# Patient Record
Sex: Male | Born: 1955 | Race: White | Hispanic: No | Marital: Married | Smoking: Never smoker
Health system: Southern US, Community
[De-identification: ages and names within clinical notes are randomized; demographics above are authoritative.]

## PROBLEM LIST (undated history)

## (undated) DIAGNOSIS — E039 Hypothyroidism, unspecified: Secondary | ICD-10-CM

## (undated) DIAGNOSIS — E119 Type 2 diabetes mellitus without complications: Secondary | ICD-10-CM

## (undated) DIAGNOSIS — M1990 Unspecified osteoarthritis, unspecified site: Secondary | ICD-10-CM

## (undated) DIAGNOSIS — I10 Essential (primary) hypertension: Secondary | ICD-10-CM

## (undated) DIAGNOSIS — K219 Gastro-esophageal reflux disease without esophagitis: Secondary | ICD-10-CM

## (undated) DIAGNOSIS — F3130 Bipolar disorder, current episode depressed, mild or moderate severity, unspecified: Secondary | ICD-10-CM

## (undated) HISTORY — PX: DUPUYTREN / PALMAR FASCIOTOMY: SUR601

## (undated) HISTORY — PX: TONSILLECTOMY: SUR1361

## (undated) HISTORY — PX: ANKLE FRACTURE SURGERY: SHX122

## (undated) HISTORY — PX: CLOSED REDUCTION HAND FRACTURE: SHX973

## (undated) HISTORY — PX: ELBOW ARTHROPLASTY: SHX928

---

## 1989-06-26 HISTORY — PX: WRIST SURGERY: SHX841

## 2001-09-04 ENCOUNTER — Other Ambulatory Visit (HOSPITAL_COMMUNITY): Admission: RE | Admit: 2001-09-04 | Discharge: 2001-09-16 | Payer: Self-pay | Admitting: Psychiatry

## 2015-03-19 ENCOUNTER — Ambulatory Visit (INDEPENDENT_AMBULATORY_CARE_PROVIDER_SITE_OTHER): Payer: Medicare Other | Admitting: Psychiatry

## 2015-03-19 ENCOUNTER — Encounter
Admission: RE | Admit: 2015-03-19 | Discharge: 2015-03-19 | Disposition: A | Discharge status: Home / Self Care | Payer: Medicare Other | Source: Ambulatory Visit | Attending: Psychiatry | Admitting: Psychiatry

## 2015-03-19 ENCOUNTER — Ambulatory Visit
Admission: RE | Admit: 2015-03-19 | Discharge: 2015-03-19 | Disposition: A | Discharge status: Home / Self Care | Payer: Medicare Other | Source: Ambulatory Visit | Attending: Psychiatry | Admitting: Psychiatry

## 2015-03-19 DIAGNOSIS — F314 Bipolar disorder, current episode depressed, severe, without psychotic features: Secondary | ICD-10-CM | POA: Diagnosis not present

## 2015-03-19 DIAGNOSIS — E119 Type 2 diabetes mellitus without complications: Secondary | ICD-10-CM

## 2015-03-19 DIAGNOSIS — Z01811 Encounter for preprocedural respiratory examination: Secondary | ICD-10-CM | POA: Diagnosis not present

## 2015-03-19 DIAGNOSIS — E039 Hypothyroidism, unspecified: Secondary | ICD-10-CM

## 2015-03-19 DIAGNOSIS — I10 Essential (primary) hypertension: Secondary | ICD-10-CM

## 2015-03-19 HISTORY — DX: Hypothyroidism, unspecified: E03.9

## 2015-03-19 HISTORY — DX: Bipolar disorder, current episode depressed, mild or moderate severity, unspecified: F31.30

## 2015-03-19 HISTORY — DX: Essential (primary) hypertension: I10

## 2015-03-19 HISTORY — DX: Type 2 diabetes mellitus without complications: E11.9

## 2015-03-19 HISTORY — DX: Gastro-esophageal reflux disease without esophagitis: K21.9

## 2015-03-19 LAB — URINALYSIS COMPLETE WITH MICROSCOPIC (ARMC ONLY)
BACTERIA UA: NONE SEEN
Bilirubin Urine: NEGATIVE
GLUCOSE, UA: 150 mg/dL — AB
Hgb urine dipstick: NEGATIVE
Ketones, ur: NEGATIVE mg/dL
Leukocytes, UA: NEGATIVE
Nitrite: NEGATIVE
Protein, ur: NEGATIVE mg/dL
RBC / HPF: NONE SEEN RBC/hpf (ref 0–5)
SQUAMOUS EPITHELIAL / LPF: NONE SEEN
Specific Gravity, Urine: 1.017 (ref 1.005–1.030)
pH: 5 (ref 5.0–8.0)

## 2015-03-19 LAB — BASIC METABOLIC PANEL
ANION GAP: 8 (ref 5–15)
BUN: 22 mg/dL — ABNORMAL HIGH (ref 6–20)
CHLORIDE: 102 mmol/L (ref 101–111)
CO2: 27 mmol/L (ref 22–32)
Calcium: 9.2 mg/dL (ref 8.9–10.3)
Creatinine, Ser: 0.92 mg/dL (ref 0.61–1.24)
GFR calc Af Amer: >60 mL/min (ref >60)
GLUCOSE: 115 mg/dL — AB (ref 65–99)
POTASSIUM: 3.8 mmol/L (ref 3.5–5.1)
Sodium: 137 mmol/L (ref 135–145)

## 2015-03-19 LAB — CBC
HEMATOCRIT: 41.9 % (ref 40.0–52.0)
HEMOGLOBIN: 14.1 g/dL (ref 13.0–18.0)
MCH: 30.1 pg (ref 26.0–34.0)
MCHC: 33.7 g/dL (ref 32.0–36.0)
MCV: 89.4 fL (ref 80.0–100.0)
PLATELETS: 331 10*3/uL (ref 150–440)
RBC: 4.68 MIL/uL (ref 4.40–5.90)
RDW: 13 % (ref 11.5–14.5)
WBC: 9.1 10*3/uL (ref 3.8–10.6)

## 2015-03-19 NOTE — Progress Notes (Signed)
[facility] MD Progress Note  03/19/2015 1:58 PM Theodore Long  MRN:  [id number] Subjective:  This is a new patient ECT consult for patient referred by Dr.Kaur Principal Problem: @ Diagnosis:  There are no active problems to display for this patient. 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 No past surgical history on file. Family History: No family history on file. Social History:  History  Alcohol Use: Not on file     History  Drug Use Not on file    Social History   Social History  . Marital Status: Married    Spouse Name: N/A  . Number of Children: N/A  . Years of Education: N/A   Social History Main Topics  . Smoking status: Not on file  . Smokeless tobacco: Not on file  . Alcohol Use: Not on file  . Drug Use: Not on file  . Sexual Activity: Not on file   Other Topics Concern  . Not on file   Social History Narrative  . No narrative on file   Additional History:  this is the full history. This is a 59 year old man with a history of bipolar disorder who is referred for ECT evaluation. Patient reports current symptoms of consistently sad depressed mood. Lack of any interest in pleasurable activities. Poor appetite poor. Poor energy level. Generally negative thinking. Passive thoughts of wishing he were dead but without any suicidal intent or plan. Poor concentration. Feeling generally miserableand negative. He denies any hallucinationsor delusions. He is currently seeing an outpatient psychiatrist and is on appropriate psychiatric medicine including Symbyax 6/50 a day and lamotrigine 150 mg twice a day. He has been on these medicines for at least a month. Continues to have no improvement in his mood. Very poor functioning right now.  Past psychiatric history as a first diagnosis of bipolar disorder in 1998 with a hospitalization. He has  had one other inpatient treatment since then. He has been managed with medication for bipolar disorder for years. He has had both episodes of mania or at least hypomania and major depression. For many years he was stable on lithium but recently became intolerant and it was discontinued this summer. No history of suicide attempts. No clear history of psychosis.  Prior medicines include lithium, Tegretol, lamotrigine, amitriptyline and his current Symbyax.  Medical history: Patient has mild hypertension-controlled on lisinopril. He has type 2 diabetes well controlled on metformin. Mild hypothyroidism. No history of coronary artery disease or stroke.  Substance abuse history: Drinks rarely. No history of alcohol or drug abuse.  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 He has recently cut down on his hours because of his depression. He is married and has a stable relationship with his wife who is understanding and willing to do transportation for him.  Family history: Nonidentified  Current medications psychiatrically lamotrigine 150 mg twice a day, clonazepam 2 mg at night, amitriptyline 150 mg at night, Symbyax 66/50 mg at night  Patient has had ECT in the past in 2007 at [facility]. He reports that he had 3 treatments done in the right unilateral fashion with good response and improvement in his mood. Does not recall serious side effects. Sleep: Fair  Appetite:  Poor   Assessment:   Musculoskeletal: Strength & Muscle  Tone: within normal limits Gait & Station: normal Patient leans: N/A   Psychiatric Specialty Exam: Physical Exam  Constitutional: He appears well-developed and well-nourished.  HENT:  Head: Normocephalic and atraumatic.  Eyes: Conjunctivae are normal. Pupils are equal, round, and reactive to light.  Neck: Normal range of motion.  Cardiovascular: Normal heart  sounds.   Respiratory: Effort normal.  GI: Soft.  Musculoskeletal: Normal range of motion.  Neurological: He is alert.  Skin: Skin is warm and dry.  Psychiatric: Judgment and thought content normal. His speech is delayed. He is slowed. He exhibits a depressed mood. He exhibits abnormal recent memory.    Review of Systems  Constitutional: Positive for malaise/fatigue.  HENT: Negative.   Eyes: Negative.   Respiratory: Negative.   Cardiovascular: Negative.   Gastrointestinal: Negative.   Musculoskeletal: Negative.   Skin: Negative.   Neurological: Positive for tremors and weakness.  Psychiatric/Behavioral: Positive for depression and memory loss. Negative for suicidal ideas, hallucinations and substance abuse. The patient has insomnia. The patient is not nervous/anxious.     There were no vitals taken for this visit.There is no height or weight on file to calculate BMI.  General Appearance: Casual  Eye Contact::  Good  Speech:  Slow  Volume:  Normal  Mood:  Depressed  Affect:  Depressed and Flat  Thought Process:  Goal Directed  Orientation:  Full (Time, Place, and Person)  Thought Content:  Negative  Suicidal Thoughts:  Yes.  without intent/plan  Homicidal Thoughts:  No  Memory:  Immediate;   Good Recent;   Fair Remote;   Fair  Judgement:  Intact  Insight:  Present  Psychomotor Activity:  Decreased  Concentration:  Fair  Recall:  Fair  Fund of Knowledge:Good  Language: Good  Akathisia:  No  Handed:  Right  AIMS (if indicated):     Assets:  Communication Skills Desire for Improvement Financial Resources/Insurance Housing Intimacy Leisure Time Physical Health Resilience Social Support Talents/Skills Transportation Vocational/Educational  ADL's:  Intact  Cognition: Impaired,  Mild  Sleep:        Current Medications: No current outpatient prescriptions on file.   No current facility-administered medications for this visit.    Lab Results: No results  found for this or any previous visit (from the past 48 hour(s)).  Physical Findings: AIMS:  , ,  ,  ,    CIWA:    COWS:     Treatment Plan Summary: Plan this is a 59 year old man with a history of bipolar disorder currently with depression severe without psychotic features. Extremely impaired. Has been getting appropriate outpatient management without response to treatment and has had some recent problems tolerating medication. He is requesting evaluation for ECT. On evaluation today the patient is a good candidate for ECT given his appropriate diagnosis, lack of response to other appropriate treatment, lack of contraindications, history of positive response to ECT, desire to receive treatment. Patient would like to get started as soon as possible. Treatment was explained to him and he was given full opportunity to ask questions. He is instructed to follow our procedure to get his lab tests done as soon as possible and then call the ECT office. I have called the ECT office myself and left a message about the arrangement I have made with him. We are hoping we may be able to get started as early as next Wednesday. He knows he can call me sooner if necessary. I will try to get in touch with Dr. Evelene Croon  as well   Medical Decision Making:  Review of Psycho-Social Stressors (1), Decision to obtain old records (1), Established Problem, Worsening (2), Review or order medicine tests (1) and Review of Medication Regimen & Side Effects (2)     John Clapacs 03/19/2015, 1:58 PM

## 2015-03-24 ENCOUNTER — Other Ambulatory Visit: Payer: Self-pay

## 2015-03-24 ENCOUNTER — Encounter: Payer: Self-pay | Admitting: Anesthesiology

## 2015-03-24 ENCOUNTER — Ambulatory Visit
Admission: RE | Admit: 2015-03-24 | Discharge: 2015-03-24 | Disposition: A | Discharge status: Home / Self Care | Payer: Medicare Other | Source: Ambulatory Visit | Attending: Psychiatry | Admitting: Psychiatry

## 2015-03-24 ENCOUNTER — Encounter: Payer: Medicare Other | Admitting: Anesthesiology

## 2015-03-24 DIAGNOSIS — I10 Essential (primary) hypertension: Secondary | ICD-10-CM | POA: Insufficient documentation

## 2015-03-24 DIAGNOSIS — K219 Gastro-esophageal reflux disease without esophagitis: Secondary | ICD-10-CM | POA: Insufficient documentation

## 2015-03-24 DIAGNOSIS — F332 Major depressive disorder, recurrent severe without psychotic features: Secondary | ICD-10-CM | POA: Insufficient documentation

## 2015-03-24 DIAGNOSIS — E039 Hypothyroidism, unspecified: Secondary | ICD-10-CM | POA: Diagnosis not present

## 2015-03-24 DIAGNOSIS — E119 Type 2 diabetes mellitus without complications: Secondary | ICD-10-CM | POA: Insufficient documentation

## 2015-03-24 LAB — GLUCOSE, CAPILLARY: GLUCOSE-CAPILLARY: 140 mg/dL — AB (ref 65–99)

## 2015-03-24 MED ORDER — DEXTROSE 5 % IV SOLN
INTRAVENOUS | Status: DC | PRN
  Administered 2015-03-24: 12:00:00 via INTRAVENOUS

## 2015-03-24 MED ORDER — METHOHEXITAL SODIUM 100 MG/10ML IV SOSY
90.0000 mg | PREFILLED_SYRINGE | Freq: Once | INTRAVENOUS | Status: AC
  Administered 2015-03-24: 90 mg via INTRAVENOUS

## 2015-03-24 MED ORDER — SODIUM CHLORIDE 0.9 % IV SOLN
250.0000 mL | Freq: Once | INTRAVENOUS | Status: AC
  Administered 2015-03-24: 250 mL via INTRAVENOUS

## 2015-03-24 MED ORDER — LIDOCAINE HCL (CARDIAC) 20 MG/ML IV SOLN
4.0000 mg | Freq: Once | INTRAVENOUS | Status: AC
  Administered 2015-03-24: 4 mg via INTRAVENOUS

## 2015-03-24 MED ORDER — SUCCINYLCHOLINE CHLORIDE 20 MG/ML IJ SOLN
120.0000 mg | Freq: Once | INTRAMUSCULAR | Status: AC
  Administered 2015-03-24: 120 mg via INTRAVENOUS

## 2015-03-24 NOTE — Transfer of Care (Signed)
Immediate Anesthesia Transfer of Care Note  Patient: Theodore Long  Procedure(s) Performed: * No procedures listed *  Patient Location: PACU  Anesthesia Type:General  Level of Consciousness: awake, alert  and oriented  Airway & Oxygen Therapy: Patient Spontanous Breathing  Post-op Assessment: Report given to RN  Post vital signs: Reviewed and stable  Last Vitals:  Filed Vitals:   03/24/15 1154  BP: 147/74  Pulse: 88  Temp: 37 C  Resp: 20    Complications: No apparent anesthesia complications

## 2015-03-24 NOTE — Procedures (Signed)
ECT SERVICES Physician's Interval Evaluation & Treatment Note  Patient Identification: Theodore Long MRN:  [id number] Date of Evaluation:  03/24/2015 TX #: 1  MADRS: 39  MMSE: 29  P.E. Findings:  No new physical exam findings. Nothing remarkable.  Psychiatric Interval Note:  Depressed. No active suicidal intent.  Subjective:  Patient is a 59 y.o. male seen for evaluation for Electroconvulsive Therapy. No specific new complaints  Treatment Summary:     Right Unilateral              Bilateral   % Energy : 0.3 ms, 60%   Impedance: 1180 ohms  Seizure Energy Index: 16,762 V squared  Postictal Suppression Index: 86%  Seizure Concordance Index: 97%  Medications  Pre Shock: Xylocaine 4 mg, Brevital 90 mg, succinylcholine 120 mg  Post Shock: None  Seizure Duration: 20 seconds by EMG, 31 seconds by EEG   Comments: Increased dose of succinylcholine 150 mg because of excess movement. Anticipate increasing energy percentage to 80%. Next treatment Friday the 30th   Lungs:    Clear to auscultation                Other:   Heart:      Regular rhythm              irregular rhythm      Previous H&P reviewed, patient examined and there are NO CHANGES                   Previous H&P reviewed, patient examined and there are changes noted.   [name], MD 9/28/201611:35 AM

## 2015-03-24 NOTE — H&P (Signed)
Theodore Long is an 59 y.o. male.   Chief Complaint: With severe major depression. Initiating ECT treatment first treatment today HPI: For treatment with right unilateral ECT today. No new physical complaints.  Past Medical History  Diagnosis Date  . Diabetes mellitus without complication   . Hypothyroidism   . GERD (gastroesophageal reflux disease)   . Hypertension   . Bipolar affect, depressed     Past Surgical History  Procedure Laterality Date  . Elbow arthroplasty Left   . Ankle fracture surgery    . Closed reduction hand fracture    . Dupuytren / palmar fasciotomy Left     Family History  Problem Relation Age of Onset  . Cancer Mother   . COPD Father    Social History:  reports that he has never smoked. He does not have any smokeless tobacco history on file. He reports that he does not drink alcohol or use illicit drugs.  Allergies:  Allergies  Allergen Reactions  . Bee Venom Shortness Of Breath and Swelling     (Not in a hospital admission)  Results for orders placed or performed during the hospital encounter of 03/24/15 (from the past 48 hour(s))  Glucose, capillary     Status: Abnormal   Collection Time: 03/24/15  9:04 AM  Result Value Ref Range   Glucose-Capillary 140 (H) 65 - 99 mg/dL   No results found.  Review of Systems  Constitutional: Negative.   HENT: Negative.   Eyes: Negative.   Respiratory: Negative.   Cardiovascular: Negative.   Gastrointestinal: Negative.   Musculoskeletal: Negative.   Skin: Negative.   Neurological: Negative.   Psychiatric/Behavioral: Positive for depression and memory loss. Negative for suicidal ideas, hallucinations and substance abuse. The patient is nervous/anxious and has insomnia.     Blood pressure 128/60, temperature 97.8 F (36.6 C), temperature source Oral, resp. rate 18, height  (1.88 m), weight 89.359 kg (197 lb), SpO2 95 %. Physical Exam  Nursing note and vitals reviewed. Constitutional: He  appears well-developed and well-nourished.  HENT:  Head: Normocephalic and atraumatic.  Eyes: Conjunctivae are normal. Pupils are equal, round, and reactive to light.  Neck: Normal range of motion.  Cardiovascular: Normal rate, regular rhythm and normal heart sounds.   Respiratory: Effort normal and breath sounds normal. No respiratory distress. He has no wheezes. He has no rales.  GI: Soft.  Musculoskeletal: Normal range of motion.  Neurological: He is alert.  Skin: Skin is warm and dry.  Psychiatric: His speech is normal and behavior is normal. Thought content normal. Cognition and memory are impaired. He exhibits a depressed mood. He exhibits abnormal recent memory.     Assessment/Plan Continue with 3 times a week treatment scheduled next treatment tended for September 30  Theodore Long 03/24/2015, 11:33 AM

## 2015-03-25 NOTE — Anesthesia Postprocedure Evaluation (Signed)
  Anesthesia Post-op Note  Patient: Theodore Long  Procedure(s) Performed: * No procedures listed *  Anesthesia type:No value filed.  Patient location: PACU  Post pain: Pain level controlled  Post assessment: Post-op Vital signs reviewed, Patient's Cardiovascular Status Stable, Respiratory Function Stable, Patent Airway and No signs of Nausea or vomiting  Post vital signs: Reviewed and stable  Last Vitals:  Filed Vitals:   03/24/15 1236  BP: 118/68  Pulse: 80  Temp:   Resp: 18    Level of consciousness: awake, alert  and patient cooperative  Complications: No apparent anesthesia complications

## 2015-03-25 NOTE — Anesthesia Preprocedure Evaluation (Signed)
Anesthesia Evaluation  Patient identified by MRN, date of birth, ID band Patient awake    Reviewed: Allergy & Precautions, H&P , NPO status , Patient's Chart, lab work & pertinent test results, reviewed documented beta blocker date and time   Airway Mallampati: II  TM Distance: >3 FB Neck ROM: full    Dental no notable dental hx.    Pulmonary neg pulmonary ROS,    Pulmonary exam normal breath sounds clear to auscultation       Cardiovascular Exercise Tolerance: Good hypertension, negative cardio ROS   Rhythm:regular Rate:Normal     Neuro/Psych negative neurological ROS  negative psych ROS   GI/Hepatic negative GI ROS, Neg liver ROS, GERD  ,  Endo/Other  negative endocrine ROSdiabetesHypothyroidism   Renal/GU negative Renal ROS  negative genitourinary   Musculoskeletal   Abdominal   Peds  Hematology negative hematology ROS (+)   Anesthesia Other Findings   Reproductive/Obstetrics negative OB ROS                             Anesthesia Physical Anesthesia Plan  ASA: III  Anesthesia Plan: General   Post-op Pain Management:    Induction:   Airway Management Planned:   Additional Equipment:   Intra-op Plan:   Post-operative Plan:   Informed Consent: I have reviewed the patients History and Physical, chart, labs and discussed the procedure including the risks, benefits and alternatives for the proposed anesthesia with the patient or authorized representative who has indicated his/her understanding and acceptance.   Dental Advisory Given  Plan Discussed with: CRNA  Anesthesia Plan Comments:         Anesthesia Quick Evaluation

## 2015-03-26 ENCOUNTER — Encounter: Payer: Self-pay | Admitting: Anesthesiology

## 2015-03-26 ENCOUNTER — Telehealth (HOSPITAL_COMMUNITY): Payer: Self-pay | Admitting: *Deleted

## 2015-03-26 ENCOUNTER — Encounter
Admission: RE | Admit: 2015-03-26 | Discharge: 2015-03-26 | Disposition: A | Discharge status: Home / Self Care | Payer: Medicare Other | Source: Ambulatory Visit | Attending: Psychiatry | Admitting: Psychiatry

## 2015-03-26 ENCOUNTER — Other Ambulatory Visit: Payer: Self-pay

## 2015-03-26 DIAGNOSIS — E119 Type 2 diabetes mellitus without complications: Secondary | ICD-10-CM | POA: Diagnosis not present

## 2015-03-26 DIAGNOSIS — K219 Gastro-esophageal reflux disease without esophagitis: Secondary | ICD-10-CM | POA: Diagnosis not present

## 2015-03-26 DIAGNOSIS — I10 Essential (primary) hypertension: Secondary | ICD-10-CM | POA: Insufficient documentation

## 2015-03-26 DIAGNOSIS — E039 Hypothyroidism, unspecified: Secondary | ICD-10-CM | POA: Insufficient documentation

## 2015-03-26 DIAGNOSIS — F332 Major depressive disorder, recurrent severe without psychotic features: Secondary | ICD-10-CM | POA: Insufficient documentation

## 2015-03-26 LAB — GLUCOSE, CAPILLARY: Glucose-Capillary: 135 mg/dL — ABNORMAL HIGH (ref 65–99)

## 2015-03-26 MED ORDER — LIDOCAINE HCL (CARDIAC) 20 MG/ML IV SOLN
4.0000 mg | Freq: Once | INTRAVENOUS | Status: AC
  Administered 2015-03-26: 4 mg via INTRAVENOUS

## 2015-03-26 MED ORDER — KETOROLAC TROMETHAMINE 30 MG/ML IJ SOLN
30.0000 mg | Freq: Once | INTRAMUSCULAR | Status: AC
  Administered 2015-03-26: 30 mg via INTRAVENOUS

## 2015-03-26 MED ORDER — METHOHEXITAL SODIUM 100 MG/10ML IV SOSY
90.0000 mg | PREFILLED_SYRINGE | Freq: Once | INTRAVENOUS | Status: AC
  Administered 2015-03-26: 90 mg via INTRAVENOUS

## 2015-03-26 MED ORDER — SODIUM CHLORIDE 0.9 % IV SOLN
250.0000 mL | Freq: Once | INTRAVENOUS | Status: AC
  Administered 2015-03-26: 250 mL via INTRAVENOUS

## 2015-03-26 MED ORDER — SUCCINYLCHOLINE CHLORIDE 20 MG/ML IJ SOLN
150.0000 mg | Freq: Once | INTRAMUSCULAR | Status: AC
  Administered 2015-03-26: 150 mg via INTRAVENOUS

## 2015-03-26 NOTE — Anesthesia Postprocedure Evaluation (Signed)
  Anesthesia Post-op Note  Patient: Theodore Long  Procedure(s) Performed: * No procedures listed *  Anesthesia type:General  Patient location: PACU  Post pain: Pain level controlled  Post assessment: Post-op Vital signs reviewed, Patient's Cardiovascular Status Stable, Respiratory Function Stable, Patent Airway and No signs of Nausea or vomiting  Post vital signs: Reviewed and stable  Last Vitals:  Filed Vitals:   03/26/15 1223  BP: 125/65  Pulse: 86  Temp:   Resp: 16    Level of consciousness: awake, alert  and patient cooperative  Complications: No apparent anesthesia complications

## 2015-03-26 NOTE — H&P (Signed)
Theodore Long is an 59 y.o. male.   Chief Complaint: Patient receiving right unilateral ECT treatment for severe recurrent major depression HPI: No significant interval change since last treatment. Had headache after treatment for a day.  Past Medical History  Diagnosis Date  . Diabetes mellitus without complication   . Hypothyroidism   . GERD (gastroesophageal reflux disease)   . Hypertension   . Bipolar affect, depressed     Past Surgical History  Procedure Laterality Date  . Elbow arthroplasty Left   . Ankle fracture surgery    . Closed reduction hand fracture    . Dupuytren / palmar fasciotomy Left     Family History  Problem Relation Age of Onset  . Cancer Mother   . COPD Father    Social History:  reports that he has never smoked. He does not have any smokeless tobacco history on file. He reports that he does not drink alcohol or use illicit drugs.  Allergies:  Allergies  Allergen Reactions  . Bee Venom Shortness Of Breath and Swelling     (Not in a hospital admission)  Results for orders placed or performed during the hospital encounter of 03/26/15 (from the past 48 hour(s))  Glucose, capillary     Status: Abnormal   Collection Time: 03/26/15  9:04 AM  Result Value Ref Range   Glucose-Capillary 135 (H) 65 - 99 mg/dL   No results found.  Review of Systems  Constitutional: Negative.   Eyes: Negative.   Respiratory: Negative.   Cardiovascular: Negative.   Gastrointestinal: Negative.   Musculoskeletal: Negative.   Skin: Negative.   Neurological: Positive for headaches.  Psychiatric/Behavioral: Positive for depression. Negative for suicidal ideas, hallucinations, memory loss and substance abuse. The patient has insomnia. The patient is not nervous/anxious.     Blood pressure 113/90, pulse 76, temperature 98.2 F (36.8 C), temperature source Oral, height  (1.88 m), weight 87.998 kg (194 lb), SpO2 99 %. Physical Exam  Nursing note and vitals  reviewed. Constitutional: He appears well-developed and well-nourished.  HENT:  Head: Normocephalic and atraumatic.  Eyes: Conjunctivae are normal. Pupils are equal, round, and reactive to light.  Neck: Normal range of motion.  Cardiovascular: Normal rate, regular rhythm and normal heart sounds.   Respiratory: Effort normal and breath sounds normal. No respiratory distress. He has no wheezes. He has no rales.  GI: Soft.  Musculoskeletal: Normal range of motion.  Neurological: He is alert.  Skin: Skin is warm and dry.  Psychiatric: His speech is normal and behavior is normal. Judgment and thought content normal. Cognition and memory are normal. He exhibits a depressed mood.     Assessment/Plan Patient will continue with current index score scheduled Monday Wednesday and Friday next week to continue treatment.  John Clapacs 03/26/2015, 11:29 AM

## 2015-03-26 NOTE — Progress Notes (Signed)
Sept 30, 2016  Mr Theodore Long is currently receiving medical treatment that is expected to go on for 3 to 4 weeks. During that time I have advised him not to engage in work requiring clinical judgement or intact short memory. I anticipate his being able to return to his usual work once treatment is complete.  Audery Amel, MD

## 2015-03-26 NOTE — Transfer of Care (Signed)
Immediate Anesthesia Transfer of Care Note  Patient: Theodore Long  Procedure(s) Performed: ECT  Patient Location: PACU  Anesthesia Type:General  Level of Consciousness: awake and patient cooperative  Airway & Oxygen Therapy: Patient Spontanous Breathing and Patient connected to face mask oxygen  Post-op Assessment: Report given to RN  Post vital signs: Reviewed and stable  Last Vitals:  Filed Vitals:   03/26/15 1145  BP: 161/93  Pulse: 102  Temp: 37.3 C  Resp: 22    Complications: No apparent anesthesia complications

## 2015-03-26 NOTE — Anesthesia Preprocedure Evaluation (Signed)
Anesthesia Evaluation  Patient identified by MRN, date of birth, ID band Patient awake    Reviewed: Allergy & Precautions, NPO status , Patient's Chart, lab work & pertinent test results  Airway Mallampati: II  TM Distance: >3 FB Neck ROM: Full    Dental  (+) Poor Dentition   Pulmonary    Pulmonary exam normal        Cardiovascular hypertension, Pt. on medications Normal cardiovascular exam     Neuro/Psych Depression Bipolar Disorder    GI/Hepatic GERD  Medicated and Controlled,  Endo/Other  diabetes, Type 2BG 135 this morning.  Renal/GU      Musculoskeletal   Abdominal (+)  Abdomen: soft.    Peds  Hematology   Anesthesia Other Findings   Reproductive/Obstetrics                             Anesthesia Physical Anesthesia Plan  ASA: III  Anesthesia Plan: General   Post-op Pain Management:    Induction: Intravenous  Airway Management Planned: Mask  Additional Equipment:   Intra-op Plan:   Post-operative Plan:   Informed Consent: I have reviewed the patients History and Physical, chart, labs and discussed the procedure including the risks, benefits and alternatives for the proposed anesthesia with the patient or authorized representative who has indicated his/her understanding and acceptance.     Plan Discussed with: CRNA  Anesthesia Plan Comments:         Anesthesia Quick Evaluation

## 2015-03-26 NOTE — Telephone Encounter (Signed)
Called for prior authorization of ECT. Was told by Ulice Bold. That no prior authorization is required for outpatient ECT code [phone]. Call reference #[id number].

## 2015-03-26 NOTE — Procedures (Signed)
ECT SERVICES Physician's Interval Evaluation & Treatment Note  Patient Identification: Theodore Long MRN:  [id number] Date of Evaluation:  03/26/2015 TX #: 2  MADRS:   MMSE:   P.E. Findings:  No new physical complaints other than mild headache after last treatment  Psychiatric Interval Note:  Mood possibly slightly improved and no worse.  Subjective:  Patient is a 59 y.o. male seen for evaluation for Electroconvulsive Therapy. Other than the headache no new complaint  Treatment Summary:     Right Unilateral              Bilateral   % Energy : 0.3 ms, 90%   Impedance: 1020 ohms  Seizure Energy Index: 16,884 V squared  Postictal Suppression Index: 93%  Seizure Concordance Index: 97%  Medications  Pre Shock: Xylocaine 4 mg, Toradol 30 mg, Brevital 90 mg, succinylcholine 150 mg  Post Shock: None  Seizure Duration: 29 seconds by EMG, 55 seconds by EEG   Comments: Continue plan for Monday Wednesday Friday treatment into next week with next treatment being on October 3   Lungs:    Clear to auscultation                Other:   Heart:      Regular rhythm              irregular rhythm      Previous H&P reviewed, patient examined and there are NO CHANGES                   Previous H&P reviewed, patient examined and there are changes noted.   [name], MD 9/30/201611:31 AM

## 2015-03-26 NOTE — Addendum Note (Signed)
Addendum  created 03/26/15 1416 by Lily Kocher, CRNA   Modules edited: Anesthesia Flowsheet

## 2015-03-29 ENCOUNTER — Encounter: Payer: Self-pay | Admitting: *Deleted

## 2015-03-29 ENCOUNTER — Encounter: Payer: Medicare Other | Admitting: *Deleted

## 2015-03-29 ENCOUNTER — Other Ambulatory Visit: Payer: Self-pay

## 2015-03-29 ENCOUNTER — Encounter
Admission: RE | Admit: 2015-03-29 | Discharge: 2015-03-29 | Disposition: A | Discharge status: Home / Self Care | Payer: Medicare Other | Source: Ambulatory Visit | Attending: Psychiatry | Admitting: Psychiatry

## 2015-03-29 DIAGNOSIS — K219 Gastro-esophageal reflux disease without esophagitis: Secondary | ICD-10-CM | POA: Diagnosis not present

## 2015-03-29 DIAGNOSIS — E119 Type 2 diabetes mellitus without complications: Secondary | ICD-10-CM | POA: Diagnosis not present

## 2015-03-29 DIAGNOSIS — E039 Hypothyroidism, unspecified: Secondary | ICD-10-CM | POA: Insufficient documentation

## 2015-03-29 DIAGNOSIS — F332 Major depressive disorder, recurrent severe without psychotic features: Secondary | ICD-10-CM | POA: Insufficient documentation

## 2015-03-29 DIAGNOSIS — I10 Essential (primary) hypertension: Secondary | ICD-10-CM | POA: Diagnosis not present

## 2015-03-29 LAB — GLUCOSE, CAPILLARY: GLUCOSE-CAPILLARY: 133 mg/dL — AB (ref 65–99)

## 2015-03-29 MED ORDER — LIDOCAINE HCL (CARDIAC) 20 MG/ML IV SOLN
4.0000 mg | Freq: Once | INTRAVENOUS | Status: AC
  Administered 2015-03-29: 4 mg via INTRAVENOUS

## 2015-03-29 MED ORDER — SUCCINYLCHOLINE CHLORIDE 20 MG/ML IJ SOLN
150.0000 mg | Freq: Once | INTRAMUSCULAR | Status: AC
  Administered 2015-03-29: 150 mg via INTRAVENOUS

## 2015-03-29 MED ORDER — SODIUM CHLORIDE 0.9 % IV SOLN
INTRAVENOUS | Status: DC | PRN
  Administered 2015-03-29: 12:00:00 via INTRAVENOUS

## 2015-03-29 MED ORDER — SODIUM CHLORIDE 0.9 % IV SOLN
250.0000 mL | Freq: Once | INTRAVENOUS | Status: AC
  Administered 2015-03-29: 250 mL via INTRAVENOUS

## 2015-03-29 MED ORDER — KETOROLAC TROMETHAMINE 30 MG/ML IJ SOLN
30.0000 mg | Freq: Once | INTRAMUSCULAR | Status: AC
  Administered 2015-03-29: 30 mg via INTRAVENOUS

## 2015-03-29 MED ORDER — METHOHEXITAL SODIUM 100 MG/10ML IV SOSY
90.0000 mg | PREFILLED_SYRINGE | Freq: Once | INTRAVENOUS | Status: AC
  Administered 2015-03-29: 90 mg via INTRAVENOUS

## 2015-03-29 NOTE — Procedures (Signed)
ECT SERVICES Physician's Interval Evaluation & Treatment Note  Patient Identification: Theodore Long MRN:  [id number] Date of Evaluation:  03/29/2015 TX #: 3  MADRS:   MMSE:   P.E. Findings:  No change to physical exam  Psychiatric Interval Note:  Mood continues to be subjectively depressed  Subjective:  Patient is a 59 y.o. male seen for evaluation for Electroconvulsive Therapy. Headache after last treatment lasting over a day area at some memory impairment.  Treatment Summary:     Right Unilateral              Bilateral   % Energy : 0.3 ms 90%   Impedance: 1240 ohms  Seizure Energy Index: 8449 V squared  Postictal Suppression Index: 83%  Seizure Concordance Index: 97%  Medications  Pre Shock: Xylocaine 4 mg, Toradol 30 mg, Brevital 90 mg, succinylcholine 150 mg  Post Shock:    Seizure Duration: 19 seconds by EMG, 49 seconds by EEG   Comments: Continue treatment Monday Wednesday and Friday schedule through the week. Patient advised to use Motrin or Tylenol at normal doses for headache.   Lungs:    Clear to auscultation                Other:   Heart:      Regular rhythm              irregular rhythm      Previous H&P reviewed, patient examined and there are NO CHANGES                   Previous H&P reviewed, patient examined and there are changes noted.   [name], MD 10/3/201611:56 AM

## 2015-03-29 NOTE — H&P (Signed)
Theodore Long is an 59 y.o. male.   Chief Complaint: Major depression recurrent severe currently receiving right unilateral ECT treatment HPI: No clear improvement in mood since last assessment. No change to physical. Some memory loss  Past Medical History  Diagnosis Date  . Diabetes mellitus without complication (HCC)   . Hypothyroidism   . GERD (gastroesophageal reflux disease)   . Hypertension   . Bipolar affect, depressed (HCC)     Past Surgical History  Procedure Laterality Date  . Elbow arthroplasty Left   . Ankle fracture surgery    . Closed reduction hand fracture    . Dupuytren / palmar fasciotomy Left     Family History  Problem Relation Age of Onset  . Cancer Mother   . COPD Father    Social History:  reports that he has never smoked. He does not have any smokeless tobacco history on file. He reports that he does not drink alcohol or use illicit drugs.  Allergies:  Allergies  Allergen Reactions  . Bee Venom Shortness Of Breath and Swelling     (Not in a hospital admission)  Results for orders placed or performed during the hospital encounter of 03/29/15 (from the past 48 hour(s))  Glucose, capillary     Status: Abnormal   Collection Time: 03/29/15  9:04 AM  Result Value Ref Range   Glucose-Capillary 133 (H) 65 - 99 mg/dL   No results found.  Review of Systems  Constitutional: Negative.   HENT: Negative.   Eyes: Negative.   Respiratory: Negative.   Cardiovascular: Negative.   Gastrointestinal: Negative.   Musculoskeletal: Negative.   Skin: Negative.   Neurological: Negative.   Psychiatric/Behavioral: Positive for depression and memory loss. Negative for suicidal ideas, hallucinations and substance abuse. The patient has insomnia. The patient is not nervous/anxious.     Blood pressure 146/70, pulse 74, temperature 96.9 F (36.1 C), temperature source Tympanic, resp. rate 18, height  (1.88 m), weight 87.998 kg (194 lb), SpO2 100 %. Physical  Exam  Nursing note and vitals reviewed. Constitutional: He appears well-developed and well-nourished.  HENT:  Head: Normocephalic and atraumatic.  Eyes: Conjunctivae are normal. Pupils are equal, round, and reactive to light.  Neck: Normal range of motion.  Cardiovascular: Normal rate, regular rhythm and normal heart sounds.   Respiratory: Effort normal and breath sounds normal. No respiratory distress. He has no wheezes. He has no rales.  GI: Soft.  Musculoskeletal: Normal range of motion.  Neurological: He is alert.  Skin: Skin is warm and dry.  Psychiatric: His speech is normal and behavior is normal. Judgment and thought content normal. He exhibits a depressed mood. He exhibits abnormal recent memory.     Assessment/Plan Continue right unilateral ECT treatment no change to treatment plan for now.  [name] 03/29/2015, 11:54 AM

## 2015-03-29 NOTE — Anesthesia Preprocedure Evaluation (Signed)
Anesthesia Evaluation  Patient identified by MRN, date of birth, ID band Patient awake    Reviewed: Allergy & Precautions, NPO status , Patient's Chart, lab work & pertinent test results  Airway Mallampati: II  TM Distance: >3 FB Neck ROM: Full    Dental  (+) Poor Dentition   Pulmonary    Pulmonary exam normal        Cardiovascular Exercise Tolerance: Good hypertension, Normal cardiovascular exam     Neuro/Psych Depression Bipolar Disorder    GI/Hepatic GERD  Medicated and Controlled,  Endo/Other  diabetes, Type 2Hypothyroidism BG 133 this morning.  Renal/GU      Musculoskeletal   Abdominal Normal abdominal exam  (+)   Peds  Hematology   Anesthesia Other Findings   Reproductive/Obstetrics                             Anesthesia Physical Anesthesia Plan  ASA: III  Anesthesia Plan: General   Post-op Pain Management:    Induction: Intravenous  Airway Management Planned: Mask  Additional Equipment:   Intra-op Plan:   Post-operative Plan:   Informed Consent: I have reviewed the patients History and Physical, chart, labs and discussed the procedure including the risks, benefits and alternatives for the proposed anesthesia with the patient or authorized representative who has indicated his/her understanding and acceptance.     Plan Discussed with: CRNA  Anesthesia Plan Comments:         Anesthesia Quick Evaluation

## 2015-03-29 NOTE — Anesthesia Postprocedure Evaluation (Signed)
  Anesthesia Post-op Note  Patient: Theodore Long  Procedure(s) Performed: * No procedures listed *  Anesthesia type:General  Patient location: PACU  Post pain: Pain level controlled  Post assessment: Post-op Vital signs reviewed, Patient's Cardiovascular Status Stable, Respiratory Function Stable, Patent Airway and No signs of Nausea or vomiting  Post vital signs: Reviewed and stable  Last Vitals:  Filed Vitals:   03/29/15 1246  BP: 131/72  Pulse: 86  Temp:   Resp: 16    Level of consciousness: awake, alert  and patient cooperative  Complications: No apparent anesthesia complications

## 2015-03-29 NOTE — Transfer of Care (Signed)
Immediate Anesthesia Transfer of Care Note  Patient: Theodore Long  Procedure(s) Performed: ECT  Patient Location: PACU  Anesthesia Type:General  Level of Consciousness: awake  Airway & Oxygen Therapy: Patient Spontanous Breathing and Patient connected to face mask oxygen  Post-op Assessment: Report given to RN and Post -op Vital signs reviewed and stable  Post vital signs: Reviewed and stable  Last Vitals:  Filed Vitals:   03/29/15 1209  BP: 158/74  Pulse: 98  Temp: 37.2 C  Resp: 13    Complications: No apparent anesthesia complications

## 2015-03-31 ENCOUNTER — Encounter: Payer: Self-pay | Admitting: Anesthesiology

## 2015-03-31 ENCOUNTER — Encounter
Admission: RE | Admit: 2015-03-31 | Discharge: 2015-03-31 | Disposition: A | Discharge status: Home / Self Care | Payer: Medicare Other | Source: Ambulatory Visit | Attending: Psychiatry | Admitting: Psychiatry

## 2015-03-31 ENCOUNTER — Encounter: Payer: Medicare Other | Admitting: Anesthesiology

## 2015-03-31 ENCOUNTER — Other Ambulatory Visit: Payer: Self-pay | Admitting: *Deleted

## 2015-03-31 ENCOUNTER — Telehealth (HOSPITAL_COMMUNITY): Payer: Self-pay | Admitting: *Deleted

## 2015-03-31 DIAGNOSIS — K219 Gastro-esophageal reflux disease without esophagitis: Secondary | ICD-10-CM | POA: Diagnosis not present

## 2015-03-31 DIAGNOSIS — F332 Major depressive disorder, recurrent severe without psychotic features: Secondary | ICD-10-CM | POA: Insufficient documentation

## 2015-03-31 DIAGNOSIS — E039 Hypothyroidism, unspecified: Secondary | ICD-10-CM | POA: Diagnosis not present

## 2015-03-31 DIAGNOSIS — I10 Essential (primary) hypertension: Secondary | ICD-10-CM | POA: Diagnosis not present

## 2015-03-31 DIAGNOSIS — E119 Type 2 diabetes mellitus without complications: Secondary | ICD-10-CM | POA: Insufficient documentation

## 2015-03-31 LAB — GLUCOSE, CAPILLARY: Glucose-Capillary: 131 mg/dL — ABNORMAL HIGH (ref 65–99)

## 2015-03-31 MED ORDER — DEXTROSE 5 % IV SOLN
INTRAVENOUS | Status: DC | PRN
  Administered 2015-03-31: 11:00:00 via INTRAVENOUS

## 2015-03-31 MED ORDER — METHOHEXITAL SODIUM 100 MG/10ML IV SOSY
90.0000 mg | PREFILLED_SYRINGE | Freq: Once | INTRAVENOUS | Status: AC
  Administered 2015-03-31: 90 mg via INTRAVENOUS

## 2015-03-31 MED ORDER — LABETALOL HCL 5 MG/ML IV SOLN: 20.0000 mg | Freq: Once | INTRAVENOUS | Status: DC

## 2015-03-31 MED ORDER — SODIUM CHLORIDE 0.9 % IV SOLN
250.0000 mL | Freq: Once | INTRAVENOUS | Status: AC
  Administered 2015-03-31: 250 mL via INTRAVENOUS

## 2015-03-31 MED ORDER — KETOROLAC TROMETHAMINE 30 MG/ML IJ SOLN
30.0000 mg | Freq: Once | INTRAMUSCULAR | Status: AC
  Administered 2015-03-31: 30 mg via INTRAVENOUS

## 2015-03-31 MED ORDER — SUCCINYLCHOLINE CHLORIDE 20 MG/ML IJ SOLN
150.0000 mg | Freq: Once | INTRAMUSCULAR | Status: AC
  Administered 2015-03-31: 150 mg via INTRAVENOUS

## 2015-03-31 MED ORDER — LIDOCAINE HCL (CARDIAC) 20 MG/ML IV SOLN
4.0000 mg | Freq: Once | INTRAVENOUS | Status: AC
  Administered 2015-03-31: 4 mg via INTRAVENOUS

## 2015-03-31 NOTE — Anesthesia Preprocedure Evaluation (Signed)
Anesthesia Evaluation  Patient identified by MRN, date of birth, ID band Patient awake    Reviewed: Allergy & Precautions, NPO status , Patient's Chart, lab work & pertinent test results  History of Anesthesia Complications Negative for: history of anesthetic complications  Airway Mallampati: II  TM Distance: >3 FB Neck ROM: Full    Dental  (+) Poor Dentition   Pulmonary neg pulmonary ROS,    Pulmonary exam normal        Cardiovascular Exercise Tolerance: Good hypertension, (-) angina(-) CAD, (-) Past MI, (-) Cardiac Stents and (-) CABG Normal cardiovascular exam(-) dysrhythmias (-) Valvular Problems/Murmurs     Neuro/Psych PSYCHIATRIC DISORDERS (bipolar) negative neurological ROS     GI/Hepatic Neg liver ROS, GERD  Medicated and Controlled,  Endo/Other  diabetes, Type 2Hypothyroidism BG 133 this morning.  Renal/GU negative Renal ROS     Musculoskeletal   Abdominal Normal abdominal exam  (+)   Peds  Hematology negative hematology ROS (+)   Anesthesia Other Findings Past Medical History:   Diabetes mellitus without complication (HCC)                 Hypothyroidism                                               GERD (gastroesophageal reflux disease)                       Hypertension                                                 Bipolar affect, depressed (HCC)                              Reproductive/Obstetrics negative OB ROS                             Anesthesia Physical  Anesthesia Plan  ASA: III  Anesthesia Plan: General   Post-op Pain Management:    Induction: Intravenous  Airway Management Planned: Mask  Additional Equipment:   Intra-op Plan:   Post-operative Plan:   Informed Consent: I have reviewed the patients History and Physical, chart, labs and discussed the procedure including the risks, benefits and alternatives for the proposed anesthesia with the patient  or authorized representative who has indicated his/her understanding and acceptance.     Plan Discussed with: CRNA, Anesthesiologist and Surgeon  Anesthesia Plan Comments:         Anesthesia Quick Evaluation

## 2015-03-31 NOTE — Telephone Encounter (Signed)
Opened in error, had already checked insurance status

## 2015-03-31 NOTE — H&P (Signed)
Theodore Long is an 59 y.o. male.   Chief Complaint: Continues to have symptoms of depression although symptoms are much improved compared to last time. No other new complaints. HPI: Patient receiving index treatment of right unilateral ECT for severe major depression.  Past Medical History  Diagnosis Date  . Diabetes mellitus without complication (HCC)   . Hypothyroidism   . GERD (gastroesophageal reflux disease)   . Hypertension   . Bipolar affect, depressed (HCC)     Past Surgical History  Procedure Laterality Date  . Elbow arthroplasty Left   . Ankle fracture surgery    . Closed reduction hand fracture    . Dupuytren / palmar fasciotomy Left     Family History  Problem Relation Age of Onset  . Cancer Mother   . COPD Father    Social History:  reports that he has never smoked. He does not have any smokeless tobacco history on file. He reports that he does not drink alcohol or use illicit drugs.  Allergies:  Allergies  Allergen Reactions  . Bee Venom Shortness Of Breath and Swelling     (Not in a hospital admission)  Results for orders placed or performed during the hospital encounter of 03/31/15 (from the past 48 hour(s))  Glucose, capillary     Status: Abnormal   Collection Time: 03/31/15  8:54 AM  Result Value Ref Range   Glucose-Capillary 131 (H) 65 - 99 mg/dL   No results found.  Review of Systems  Constitutional: Negative.   HENT: Negative.   Eyes: Negative.   Respiratory: Negative.   Cardiovascular: Negative.   Gastrointestinal: Negative.   Musculoskeletal: Negative.   Skin: Negative.   Neurological: Negative.   Psychiatric/Behavioral: Positive for depression. Negative for suicidal ideas, hallucinations, memory loss and substance abuse. The patient is not nervous/anxious and does not have insomnia.     Blood pressure 149/74, pulse 71, temperature 97 F (36.1 C), temperature source Tympanic, resp. rate 18, height  (1.88 m), weight 87.998 kg  (194 lb), SpO2 98 %. Physical Exam  Nursing note reviewed. Constitutional: He appears well-developed and well-nourished.  HENT:  Head: Normocephalic and atraumatic.  Eyes: Conjunctivae are normal. Pupils are equal, round, and reactive to light.  Neck: Normal range of motion.  Cardiovascular: Normal rate, regular rhythm and normal heart sounds.   Respiratory: Effort normal and breath sounds normal. No respiratory distress. He has no wheezes. He has no rales.  GI: Soft.  Musculoskeletal: Normal range of motion.  Neurological: He is alert.  Skin: Skin is warm and dry.  Psychiatric: His speech is normal and behavior is normal. Judgment and thought content normal. Cognition and memory are normal. He exhibits a depressed mood.     Assessment/Plan No change to treatment plan. Continue 3 times a week treatment while monitoring continued improvement  and side effects  John Clapacs 03/31/2015, 11:19 AM

## 2015-03-31 NOTE — Transfer of Care (Signed)
Immediate Anesthesia Transfer of Care Note  Patient: Theodore Long  Procedure(s) Performed: * No procedures listed *  Patient Location: PACU  Anesthesia Type:General  Level of Consciousness: awake  Airway & Oxygen Therapy: Patient Spontanous Breathing and Patient connected to face mask oxygen  Post-op Assessment: Report given to RN and Post -op Vital signs reviewed and stable  Post vital signs: Reviewed and stable  Last Vitals:  Filed Vitals:   03/31/15 1139  BP:   Pulse:   Temp: 36.9 C  Resp:     Complications: No apparent anesthesia complications

## 2015-03-31 NOTE — Anesthesia Procedure Notes (Signed)
Date/Time: 03/31/2015 11:20 AM Performed by: Henrietta Hoover Pre-anesthesia Checklist: Patient identified, Emergency Drugs available, Suction available, Patient being monitored and Timeout performed Patient Re-evaluated:Patient Re-evaluated prior to inductionOxygen Delivery Method: Circle system utilized Preoxygenation: Pre-oxygenation with 100% oxygen Intubation Type: IV induction Ventilation: Mask ventilation without difficulty and Oral airway inserted - appropriate to patient size Placement Confirmation: positive ETCO2 Dental Injury: Teeth and Oropharynx as per pre-operative assessment

## 2015-03-31 NOTE — Procedures (Signed)
ECT SERVICES Physician's Interval Evaluation & Treatment Note  Patient Identification: Theodore Long MRN:  [id number] Date of Evaluation:  03/31/2015 TX #: 4  MADRS: 29  MMSE: 30  P.E. Findings:  No change to physical exam  Psychiatric Interval Note:  Gradual improvement in depression no new side effects  Subjective:  Patient is a 59 y.o. male seen for evaluation for Electroconvulsive Therapy. No specific complaint  Treatment Summary:     Right Unilateral              Bilateral   % Energy : 0.3 ms 90%   Impedance: 1490 ohms  Seizure Energy Index: 13,749 V squared  Postictal Suppression Index: 91%  Seizure Concordance Index: 96%  Medications  Pre Shock: Xylocaine 4 mg, Toradol 30 mg, Brevital 90 mg, succinylcholine 150 mg  Post Shock:    Seizure Duration: 14 seconds by EMG, 41 seconds by EEG   Comments: Continue scheduled for index with next treatment Friday the seventh   Lungs:    Clear to auscultation                Other:   Heart:      Regular rhythm              irregular rhythm      Previous H&P reviewed, patient examined and there are NO CHANGES                   Previous H&P reviewed, patient examined and there are changes noted.   [name], MD 10/5/201611:22 AM

## 2015-04-01 NOTE — Anesthesia Postprocedure Evaluation (Signed)
  Anesthesia Post-op Note  Patient: Theodore Long  Procedure(s) Performed: * No procedures listed *  Anesthesia type:General  Patient location: PACU  Post pain: Pain level controlled  Post assessment: Post-op Vital signs reviewed, Patient's Cardiovascular Status Stable, Respiratory Function Stable, Patent Airway and No signs of Nausea or vomiting  Post vital signs: Reviewed and stable  Last Vitals:  Filed Vitals:   03/31/15 1220  BP: 138/70  Pulse: 83  Temp: 37.4 C  Resp: 18    Level of consciousness: awake, alert  and patient cooperative  Complications: No apparent anesthesia complications

## 2015-04-02 ENCOUNTER — Encounter: Payer: Medicare Other | Admitting: Anesthesiology

## 2015-04-02 ENCOUNTER — Other Ambulatory Visit: Payer: Self-pay

## 2015-04-02 ENCOUNTER — Encounter: Payer: Self-pay | Admitting: Anesthesiology

## 2015-04-02 ENCOUNTER — Encounter
Admission: RE | Admit: 2015-04-02 | Discharge: 2015-04-02 | Disposition: A | Discharge status: Home / Self Care | Payer: Medicare Other | Source: Ambulatory Visit | Attending: Psychiatry | Admitting: Psychiatry

## 2015-04-02 DIAGNOSIS — E039 Hypothyroidism, unspecified: Secondary | ICD-10-CM | POA: Diagnosis not present

## 2015-04-02 DIAGNOSIS — K219 Gastro-esophageal reflux disease without esophagitis: Secondary | ICD-10-CM | POA: Diagnosis not present

## 2015-04-02 DIAGNOSIS — I10 Essential (primary) hypertension: Secondary | ICD-10-CM | POA: Insufficient documentation

## 2015-04-02 DIAGNOSIS — E119 Type 2 diabetes mellitus without complications: Secondary | ICD-10-CM | POA: Insufficient documentation

## 2015-04-02 DIAGNOSIS — F332 Major depressive disorder, recurrent severe without psychotic features: Secondary | ICD-10-CM | POA: Insufficient documentation

## 2015-04-02 LAB — GLUCOSE, CAPILLARY: Glucose-Capillary: 125 mg/dL — ABNORMAL HIGH (ref 65–99)

## 2015-04-02 MED ORDER — SODIUM CHLORIDE 0.9 % IV SOLN
250.0000 mL | Freq: Once | INTRAVENOUS | Status: AC
  Administered 2015-04-02: 250 mL via INTRAVENOUS

## 2015-04-02 MED ORDER — SUCCINYLCHOLINE CHLORIDE 20 MG/ML IJ SOLN
150.0000 mg | Freq: Once | INTRAMUSCULAR | Status: AC
  Administered 2015-04-02: 150 mg via INTRAVENOUS

## 2015-04-02 MED ORDER — KETOROLAC TROMETHAMINE 30 MG/ML IJ SOLN
30.0000 mg | Freq: Once | INTRAMUSCULAR | Status: AC
  Administered 2015-04-02: 30 mg via INTRAVENOUS

## 2015-04-02 MED ORDER — LIDOCAINE HCL (CARDIAC) 20 MG/ML IV SOLN
4.0000 mg | Freq: Once | INTRAVENOUS | Status: AC
  Administered 2015-04-02: 4 mg via INTRAVENOUS

## 2015-04-02 MED ORDER — METHOHEXITAL SODIUM 100 MG/10ML IV SOSY
90.0000 mg | PREFILLED_SYRINGE | Freq: Once | INTRAVENOUS | Status: AC
  Administered 2015-04-02: 90 mg via INTRAVENOUS

## 2015-04-02 MED ORDER — SODIUM CHLORIDE 0.9 % IV SOLN
INTRAVENOUS | Status: DC | PRN
  Administered 2015-04-02: 11:00:00 via INTRAVENOUS

## 2015-04-02 NOTE — Anesthesia Preprocedure Evaluation (Addendum)
Anesthesia Evaluation  Patient identified by MRN, date of birth, ID band Patient awake    Reviewed: Allergy & Precautions, H&P , NPO status , Patient's Chart, lab work & pertinent test results, reviewed documented beta blocker date and time   Airway Mallampati: II  TM Distance: >3 FB Neck ROM: full    Dental no notable dental hx.    Pulmonary neg pulmonary ROS,    Pulmonary exam normal breath sounds clear to auscultation       Cardiovascular Exercise Tolerance: Good hypertension, negative cardio ROS   Rhythm:regular Rate:Normal     Neuro/Psych negative neurological ROS  negative psych ROS   GI/Hepatic negative GI ROS, Neg liver ROS, GERD  ,  Endo/Other  negative endocrine ROSdiabetesHypothyroidism   Renal/GU      Musculoskeletal   Abdominal   Peds  Hematology negative hematology ROS (+)   Anesthesia Other Findings   Reproductive/Obstetrics negative OB ROS                             Anesthesia Physical  Anesthesia Plan  ASA: IV  Anesthesia Plan: General   Post-op Pain Management:    Induction: Intravenous  Airway Management Planned: Mask  Additional Equipment:   Intra-op Plan:   Post-operative Plan:   Informed Consent:   Plan Discussed with:   Anesthesia Plan Comments:         Anesthesia Quick Evaluation                                   Anesthesia Evaluation  Patient identified by MRN, date of birth, ID band Patient awake    Reviewed: Allergy & Precautions, H&P , NPO status , Patient's Chart, lab work & pertinent test results, reviewed documented beta blocker date and time   Airway Mallampati: II  TM Distance: >3 FB Neck ROM: full    Dental no notable dental hx.    Pulmonary neg pulmonary ROS,    Pulmonary exam normal breath sounds clear to auscultation       Cardiovascular Exercise Tolerance: Good hypertension, negative cardio  ROS   Rhythm:regular Rate:Normal     Neuro/Psych negative neurological ROS  negative psych ROS   GI/Hepatic negative GI ROS, Neg liver ROS, GERD  ,  Endo/Other  negative endocrine ROSdiabetesHypothyroidism   Renal/GU negative Renal ROS  negative genitourinary   Musculoskeletal   Abdominal   Peds  Hematology negative hematology ROS (+)   Anesthesia Other Findings   Reproductive/Obstetrics negative OB ROS                             Anesthesia Physical Anesthesia Plan  ASA: III  Anesthesia Plan: General   Post-op Pain Management:    Induction:   Airway Management Planned:   Additional Equipment:   Intra-op Plan:   Post-operative Plan:   Informed Consent: I have reviewed the patients History and Physical, chart, labs and discussed the procedure including the risks, benefits and alternatives for the proposed anesthesia with the patient or authorized representative who has indicated his/her understanding and acceptance.   Dental Advisory Given  Plan Discussed with: CRNA  Anesthesia Plan Comments:         Anesthesia Quick Evaluation  

## 2015-04-02 NOTE — Transfer of Care (Signed)
Immediate Anesthesia Transfer of Care Note  Patient: Theodore Long  Procedure(s) Performed: * No procedures listed *  Patient Location: PACU  Anesthesia Type:General  Level of Consciousness: alert  and sedated  Airway & Oxygen Therapy: Patient Spontanous Breathing and Patient connected to face mask oxygen  Post-op Assessment: Report given to RN and Post -op Vital signs reviewed and stable  Post vital signs: Reviewed and stable  Last Vitals:  Filed Vitals:   04/02/15 0836  BP: 145/69  Pulse: 75  Temp: 36.6 C    Complications: No apparent anesthesia complications

## 2015-04-02 NOTE — Procedures (Signed)
ECT SERVICES Physician's Interval Evaluation & Treatment Note  Patient Identification: Theodore Long MRN:  [id number] Date of Evaluation:  04/02/2015 TX #: 5  MADRS:   MMSE:   P.E. Findings:  No change to physical exam  Psychiatric Interval Note:  Mood is continuing to improve without any significant side effects  Subjective:  Patient is a 59 y.o. male seen for evaluation for Electroconvulsive Therapy. No new complaints  Treatment Summary:     Right Unilateral              Bilateral   % Energy : 0.3 ms 90%   Impedance: 1310 ohms  Seizure Energy Index: 15,905 V squared  Postictal Suppression Index: 94%  Seizure Concordance Index: 99%  Medications  Pre Shock: Xylocaine 4 mg, Toradol 30 mg, Brevital 90 mg, succinylcholine 150 mg  Post Shock:    Seizure Duration: 20 seconds by EMG, 42 seconds by EEG   Comments: Continued improvement at treatment #5 plan to continue index course into next week   Lungs:    Clear to auscultation                Other:   Heart:      Regular rhythm              irregular rhythm      Previous H&P reviewed, patient examined and there are NO CHANGES                   Previous H&P reviewed, patient examined and there are changes noted.   [name], MD 10/7/201611:08 AM

## 2015-04-02 NOTE — Anesthesia Procedure Notes (Signed)
Date/Time: 04/02/2015 11:07 AM Performed by: Junious Silk Pre-anesthesia Checklist: Patient identified, Timeout performed, Emergency Drugs available, Suction available and Patient being monitored Patient Re-evaluated:Patient Re-evaluated prior to inductionOxygen Delivery Method: Ambu bag

## 2015-04-02 NOTE — H&P (Signed)
Theodore Long is an 59 y.o. male.   Chief Complaint: Mood is gradually improving. Continues index course of ECT for severe depression HPI: Slightly improved since last time no problems with memory  Past Medical History  Diagnosis Date  . Diabetes mellitus without complication (HCC)   . Hypothyroidism   . GERD (gastroesophageal reflux disease)   . Hypertension   . Bipolar affect, depressed (HCC)     Past Surgical History  Procedure Laterality Date  . Elbow arthroplasty Left   . Ankle fracture surgery    . Closed reduction hand fracture    . Dupuytren / palmar fasciotomy Left     Family History  Problem Relation Age of Onset  . Cancer Mother   . COPD Father    Social History:  reports that he has never smoked. He does not have any smokeless tobacco history on file. He reports that he does not drink alcohol or use illicit drugs.  Allergies:  Allergies  Allergen Reactions  . Bee Venom Shortness Of Breath and Swelling     (Not in a hospital admission)  Results for orders placed or performed during the hospital encounter of 04/02/15 (from the past 48 hour(s))  Glucose, capillary     Status: Abnormal   Collection Time: 04/02/15  8:49 AM  Result Value Ref Range   Glucose-Capillary 125 (H) 65 - 99 mg/dL   No results found.  Review of Systems  Constitutional: Negative.   HENT: Negative.   Eyes: Negative.   Respiratory: Negative.   Cardiovascular: Negative.   Gastrointestinal: Negative.   Musculoskeletal: Negative.   Skin: Negative.   Neurological: Negative.   Psychiatric/Behavioral: Positive for depression. Negative for suicidal ideas, hallucinations, memory loss and substance abuse. The patient is not nervous/anxious and does not have insomnia.     Blood pressure 145/69, pulse 75, temperature 97.9 F (36.6 C), temperature source Oral, weight 87.998 kg (194 lb), SpO2 99 %. Physical Exam  Nursing note and vitals reviewed. Constitutional: He appears well-developed  and well-nourished.  HENT:  Head: Normocephalic and atraumatic.  Eyes: Conjunctivae are normal. Pupils are equal, round, and reactive to light.  Neck: Normal range of motion.  Cardiovascular: Normal rate, regular rhythm and normal heart sounds.   Respiratory: Effort normal and breath sounds normal. No respiratory distress. He has no wheezes. He has no rales.  GI: Soft.  Musculoskeletal: Normal range of motion.  Neurological: He is alert.  Skin: Skin is warm and dry.  Psychiatric: He has a normal mood and affect. His behavior is normal. Judgment and thought content normal.     Assessment/Plan Patient is gradually improving. Continue 3 times a week treatment into next week.  Teshia Mahone 04/02/2015, 11:06 AM

## 2015-04-02 NOTE — Anesthesia Postprocedure Evaluation (Signed)
  Anesthesia Post-op Note  Patient: Theodore Long  Procedure(s) Performed: * No procedures listed *  Anesthesia type:No value filed.  Patient location: PACU  Post pain: Pain level controlled  Post assessment: Post-op Vital signs reviewed, Patient's Cardiovascular Status Stable, Respiratory Function Stable, Patent Airway and No signs of Nausea or vomiting  Post vital signs: Reviewed and stable  Last Vitals:  Filed Vitals:   04/02/15 1211  BP:   Pulse: 88  Temp:   Resp: 16    Level of consciousness: awake, alert  and patient cooperative  Complications: No apparent anesthesia complications

## 2015-04-05 ENCOUNTER — Encounter: Payer: Medicare Other | Admitting: Anesthesiology

## 2015-04-05 ENCOUNTER — Other Ambulatory Visit: Payer: Self-pay | Admitting: *Deleted

## 2015-04-05 ENCOUNTER — Encounter
Admission: RE | Admit: 2015-04-05 | Discharge: 2015-04-05 | Disposition: A | Discharge status: Home / Self Care | Payer: Medicare Other | Source: Ambulatory Visit | Attending: Psychiatry | Admitting: Psychiatry

## 2015-04-05 ENCOUNTER — Encounter: Payer: Self-pay | Admitting: Anesthesiology

## 2015-04-05 DIAGNOSIS — I10 Essential (primary) hypertension: Secondary | ICD-10-CM | POA: Insufficient documentation

## 2015-04-05 DIAGNOSIS — K219 Gastro-esophageal reflux disease without esophagitis: Secondary | ICD-10-CM | POA: Insufficient documentation

## 2015-04-05 DIAGNOSIS — E039 Hypothyroidism, unspecified: Secondary | ICD-10-CM | POA: Insufficient documentation

## 2015-04-05 DIAGNOSIS — F332 Major depressive disorder, recurrent severe without psychotic features: Secondary | ICD-10-CM | POA: Diagnosis present

## 2015-04-05 DIAGNOSIS — E119 Type 2 diabetes mellitus without complications: Secondary | ICD-10-CM | POA: Diagnosis not present

## 2015-04-05 LAB — GLUCOSE, CAPILLARY: GLUCOSE-CAPILLARY: 151 mg/dL — AB (ref 65–99)

## 2015-04-05 MED ORDER — LIDOCAINE HCL (CARDIAC) 20 MG/ML IV SOLN
4.0000 mg | Freq: Once | INTRAVENOUS | Status: AC
  Administered 2015-04-05: 4 mg via INTRAVENOUS

## 2015-04-05 MED ORDER — KETOROLAC TROMETHAMINE 30 MG/ML IJ SOLN
30.0000 mg | Freq: Once | INTRAMUSCULAR | Status: AC
  Administered 2015-04-05: 30 mg via INTRAVENOUS

## 2015-04-05 MED ORDER — SUCCINYLCHOLINE CHLORIDE 20 MG/ML IJ SOLN
150.0000 mg | Freq: Once | INTRAMUSCULAR | Status: AC
  Administered 2015-04-05: 150 mg via INTRAVENOUS

## 2015-04-05 MED ORDER — SODIUM CHLORIDE 0.9 % IV SOLN
250.0000 mL | Freq: Once | INTRAVENOUS | Status: AC
  Administered 2015-04-05: 250 mL via INTRAVENOUS

## 2015-04-05 MED ORDER — SODIUM CHLORIDE 0.9 % IV SOLN
INTRAVENOUS | Status: DC | PRN
  Administered 2015-04-05: 11:00:00 via INTRAVENOUS

## 2015-04-05 MED ORDER — METHOHEXITAL SODIUM 100 MG/10ML IV SOSY
90.0000 mg | PREFILLED_SYRINGE | Freq: Once | INTRAVENOUS | Status: AC
  Administered 2015-04-05: 90 mg via INTRAVENOUS

## 2015-04-05 NOTE — Procedures (Signed)
ECT SERVICES Physician's Interval Evaluation & Treatment Note  Patient Identification: Theodore Long MRN:  [id number] Date of Evaluation:  04/05/2015 TX #: 6  MADRS:   MMSE:   P.E. Findings:  No new physical exam changes  Psychiatric Interval Note:  Mood continues to feel improved but notices memory impairment  Subjective:  Patient is a 59 y.o. male seen for evaluation for Electroconvulsive Therapy. Mild memory impairment and "spaciness"  Treatment Summary:     Right Unilateral              Bilateral   % Energy : 0.3 ms 90%   Impedance: 1170 ohms  Seizure Energy Index: 12,443 V squared  Postictal Suppression Index: 91%  Seizure Concordance Index: 98%  Medications  Pre Shock: Xylocaine 4 mg, Toradol 30 mg, Brevital 90 mg, succinylcholine 150 mg  Post Shock:    Seizure Duration: 22 seconds by EMG, 27 seconds by EEG   Comments: Follow-up on Wednesday   Lungs:    Clear to auscultation                Other:   Heart:      Regular rhythm              irregular rhythm      Previous H&P reviewed, patient examined and there are NO CHANGES                   Previous H&P reviewed, patient examined and there are changes noted.   [name], MD 10/10/201611:10 AM

## 2015-04-05 NOTE — H&P (Signed)
Theodore Long is an 59 y.o. male.   Chief Complaint: Continued index course of ECT for severe major depression HPI: Since last treatment patient feels he continues to show improvement in his mood although there is notable memory impairment  Past Medical History  Diagnosis Date  . Diabetes mellitus without complication (HCC)   . Hypothyroidism   . GERD (gastroesophageal reflux disease)   . Hypertension   . Bipolar affect, depressed (HCC)     Past Surgical History  Procedure Laterality Date  . Elbow arthroplasty Left   . Ankle fracture surgery    . Closed reduction hand fracture    . Dupuytren / palmar fasciotomy Left     Family History  Problem Relation Age of Onset  . Cancer Mother   . COPD Father    Social History:  reports that he has never smoked. He does not have any smokeless tobacco history on file. He reports that he does not drink alcohol or use illicit drugs.  Allergies:  Allergies  Allergen Reactions  . Bee Venom Shortness Of Breath and Swelling     (Not in a hospital admission)  Results for orders placed or performed during the hospital encounter of 04/05/15 (from the past 48 hour(s))  Glucose, capillary     Status: Abnormal   Collection Time: 04/05/15  9:22 AM  Result Value Ref Range   Glucose-Capillary 151 (H) 65 - 99 mg/dL   No results found.  Review of Systems  Constitutional: Negative.   HENT: Negative.   Eyes: Negative.   Respiratory: Negative.   Cardiovascular: Negative.   Gastrointestinal: Negative.   Musculoskeletal: Negative.   Skin: Negative.   Neurological: Negative.   Psychiatric/Behavioral: Positive for memory loss. Negative for depression, suicidal ideas, hallucinations and substance abuse. The patient is not nervous/anxious and does not have insomnia.     Blood pressure 154/61, pulse 92, temperature 98.3 F (36.8 C), temperature source Oral, resp. rate 16, height  (1.88 m), weight 86.637 kg (191 lb), SpO2 100 %. Physical  Exam  Nursing note and vitals reviewed. Constitutional: He appears well-developed and well-nourished.  HENT:  Head: Normocephalic and atraumatic.  Eyes: Conjunctivae are normal. Pupils are equal, round, and reactive to light.  Neck: Normal range of motion.  Cardiovascular: Normal rate, regular rhythm and normal heart sounds.   Respiratory: Effort normal and breath sounds normal. No respiratory distress. He has no wheezes.  GI: Soft.  Musculoskeletal: Normal range of motion.  Neurological: He is alert.  Skin: Skin is warm and dry.  Psychiatric: He has a normal mood and affect. His speech is normal. Judgment and thought content normal. He is slowed. He exhibits abnormal recent memory.     Assessment/Plan Patient will continue index course and be seen again on Wednesday the 12th  Theodore Long 04/05/2015, 11:07 AM

## 2015-04-05 NOTE — Transfer of Care (Signed)
Immediate Anesthesia Transfer of Care Note  Patient: Theodore Long  Procedure(s) Performed: ECT  Patient Location: PACU  Anesthesia Type:General  Level of Consciousness: awake  Airway & Oxygen Therapy: Patient Spontanous Breathing and Patient connected to face mask oxygen  Post-op Assessment: Report given to RN and Post -op Vital signs reviewed and stable  Post vital signs: stable  Last Vitals:  Filed Vitals:   04/05/15 1124  BP: 171/82  Pulse: 109  Temp: 37.4 C  Resp: 18    Complications: No apparent anesthesia complications

## 2015-04-05 NOTE — Anesthesia Preprocedure Evaluation (Signed)
Anesthesia Evaluation  Patient identified by MRN, date of birth, ID band Patient awake    Reviewed: Allergy & Precautions, NPO status , Patient's Chart, lab work & pertinent test results  Airway Mallampati: II  TM Distance: >3 FB Neck ROM: Limited    Dental  (+) Poor Dentition   Pulmonary    Pulmonary exam normal        Cardiovascular hypertension, Pt. on medications Normal cardiovascular exam     Neuro/Psych PSYCHIATRIC DISORDERS Depression Bipolar Disorder    GI/Hepatic GERD  Medicated and Controlled,  Endo/Other  diabetes, Type 2Hypothyroidism BG 151.  Renal/GU      Musculoskeletal   Abdominal (+)  Abdomen: soft.    Peds  Hematology   Anesthesia Other Findings   Reproductive/Obstetrics                             Anesthesia Physical Anesthesia Plan  ASA: III  Anesthesia Plan: General   Post-op Pain Management:    Induction: Intravenous  Airway Management Planned: Mask  Additional Equipment:   Intra-op Plan:   Post-operative Plan:   Informed Consent: I have reviewed the patients History and Physical, chart, labs and discussed the procedure including the risks, benefits and alternatives for the proposed anesthesia with the patient or authorized representative who has indicated his/her understanding and acceptance.     Plan Discussed with: CRNA  Anesthesia Plan Comments:         Anesthesia Quick Evaluation

## 2015-04-05 NOTE — Anesthesia Postprocedure Evaluation (Signed)
  Anesthesia Post-op Note  Patient: Theodore Long  Procedure(s) Performed: * No procedures listed *  Anesthesia type:General  Patient location: PACU  Post pain: Pain level controlled  Post assessment: Post-op Vital signs reviewed, Patient's Cardiovascular Status Stable, Respiratory Function Stable, Patent Airway and No signs of Nausea or vomiting  Post vital signs: Reviewed and stable  Last Vitals:  Filed Vitals:   04/05/15 1144  BP: 150/86  Pulse: 102  Temp:   Resp: 15    Level of consciousness: awake, alert  and patient cooperative  Complications: No apparent anesthesia complications

## 2015-04-07 ENCOUNTER — Encounter: Payer: Self-pay | Admitting: Anesthesiology

## 2015-04-07 ENCOUNTER — Encounter
Admission: RE | Admit: 2015-04-07 | Discharge: 2015-04-07 | Disposition: A | Discharge status: Home / Self Care | Payer: Medicare Other | Source: Ambulatory Visit | Attending: Psychiatry | Admitting: Psychiatry

## 2015-04-07 ENCOUNTER — Other Ambulatory Visit: Payer: Self-pay | Admitting: *Deleted

## 2015-04-07 DIAGNOSIS — E119 Type 2 diabetes mellitus without complications: Secondary | ICD-10-CM | POA: Insufficient documentation

## 2015-04-07 DIAGNOSIS — K219 Gastro-esophageal reflux disease without esophagitis: Secondary | ICD-10-CM | POA: Insufficient documentation

## 2015-04-07 DIAGNOSIS — E039 Hypothyroidism, unspecified: Secondary | ICD-10-CM | POA: Diagnosis not present

## 2015-04-07 DIAGNOSIS — R413 Other amnesia: Secondary | ICD-10-CM | POA: Diagnosis not present

## 2015-04-07 DIAGNOSIS — F332 Major depressive disorder, recurrent severe without psychotic features: Secondary | ICD-10-CM

## 2015-04-07 DIAGNOSIS — I10 Essential (primary) hypertension: Secondary | ICD-10-CM | POA: Insufficient documentation

## 2015-04-07 LAB — GLUCOSE, CAPILLARY: Glucose-Capillary: 160 mg/dL — ABNORMAL HIGH (ref 65–99)

## 2015-04-07 MED ORDER — SODIUM CHLORIDE 0.9 % IV SOLN
250.0000 mL | Freq: Once | INTRAVENOUS | Status: AC
  Administered 2015-04-07: 250 mL via INTRAVENOUS

## 2015-04-07 MED ORDER — LIDOCAINE HCL (CARDIAC) 20 MG/ML IV SOLN
4.0000 mg | Freq: Once | INTRAVENOUS | Status: AC
  Administered 2015-04-07: 4 mg via INTRAVENOUS

## 2015-04-07 MED ORDER — METHOHEXITAL SODIUM 100 MG/10ML IV SOSY
90.0000 mg | PREFILLED_SYRINGE | Freq: Once | INTRAVENOUS | Status: AC
  Administered 2015-04-07: 90 mg via INTRAVENOUS

## 2015-04-07 MED ORDER — KETOROLAC TROMETHAMINE 30 MG/ML IJ SOLN
30.0000 mg | Freq: Once | INTRAMUSCULAR | Status: AC
  Administered 2015-04-07: 30 mg via INTRAVENOUS

## 2015-04-07 MED ORDER — SUCCINYLCHOLINE CHLORIDE 20 MG/ML IJ SOLN
150.0000 mg | Freq: Once | INTRAMUSCULAR | Status: AC
  Administered 2015-04-07: 150 mg via INTRAVENOUS

## 2015-04-07 NOTE — Anesthesia Preprocedure Evaluation (Signed)
Anesthesia Evaluation  Patient identified by MRN, date of birth, ID band Patient awake    Reviewed: Allergy & Precautions, H&P , NPO status , Patient's Chart, lab work & pertinent test results, reviewed documented beta blocker date and time   Airway Mallampati: II  TM Distance: >3 FB Neck ROM: full    Dental no notable dental hx.    Pulmonary neg pulmonary ROS,    Pulmonary exam normal breath sounds clear to auscultation       Cardiovascular Exercise Tolerance: Good hypertension, negative cardio ROS   Rhythm:regular Rate:Normal     Neuro/Psych negative neurological ROS  negative psych ROS   GI/Hepatic negative GI ROS, Neg liver ROS, GERD  ,  Endo/Other  negative endocrine ROSdiabetesHypothyroidism   Renal/GU      Musculoskeletal   Abdominal   Peds  Hematology negative hematology ROS (+)   Anesthesia Other Findings   Reproductive/Obstetrics negative OB ROS                             Anesthesia Physical  Anesthesia Plan  ASA: IV  Anesthesia Plan: General   Post-op Pain Management:    Induction: Intravenous  Airway Management Planned: Mask  Additional Equipment:   Intra-op Plan:   Post-operative Plan:   Informed Consent:   Plan Discussed with:   Anesthesia Plan Comments:         Anesthesia Quick Evaluation                                   Anesthesia Evaluation  Patient identified by MRN, date of birth, ID band Patient awake    Reviewed: Allergy & Precautions, H&P , NPO status , Patient's Chart, lab work & pertinent test results, reviewed documented beta blocker date and time   Airway Mallampati: II  TM Distance: >3 FB Neck ROM: full    Dental no notable dental hx.    Pulmonary neg pulmonary ROS,    Pulmonary exam normal breath sounds clear to auscultation       Cardiovascular Exercise Tolerance: Good hypertension, negative cardio  ROS   Rhythm:regular Rate:Normal     Neuro/Psych negative neurological ROS  negative psych ROS   GI/Hepatic negative GI ROS, Neg liver ROS, GERD  ,  Endo/Other  negative endocrine ROSdiabetesHypothyroidism   Renal/GU negative Renal ROS  negative genitourinary   Musculoskeletal   Abdominal   Peds  Hematology negative hematology ROS (+)   Anesthesia Other Findings   Reproductive/Obstetrics negative OB ROS                             Anesthesia Physical Anesthesia Plan  ASA: III  Anesthesia Plan: General   Post-op Pain Management:    Induction:   Airway Management Planned:   Additional Equipment:   Intra-op Plan:   Post-operative Plan:   Informed Consent: I have reviewed the patients History and Physical, chart, labs and discussed the procedure including the risks, benefits and alternatives for the proposed anesthesia with the patient or authorized representative who has indicated his/her understanding and acceptance.   Dental Advisory Given  Plan Discussed with: CRNA  Anesthesia Plan Comments:         Anesthesia Quick Evaluation

## 2015-04-07 NOTE — H&P (Signed)
Theodore Long is an 59 y.o. male.   Chief Complaint: No new specific complaint other than noticing some memory loss HPI: Patient is receiving ECT for severe depression. He has shown market improvement and appears to be plateauing  Past Medical History  Diagnosis Date  . Diabetes mellitus without complication (HCC)   . Hypothyroidism   . GERD (gastroesophageal reflux disease)   . Hypertension   . Bipolar affect, depressed (HCC)     Past Surgical History  Procedure Laterality Date  . Elbow arthroplasty Left   . Ankle fracture surgery    . Closed reduction hand fracture    . Dupuytren / palmar fasciotomy Left     Family History  Problem Relation Age of Onset  . Cancer Mother   . COPD Father    Social History:  reports that he has never smoked. He does not have any smokeless tobacco history on file. He reports that he does not drink alcohol or use illicit drugs.  Allergies:  Allergies  Allergen Reactions  . Bee Venom Shortness Of Breath and Swelling     (Not in a hospital admission)  Results for orders placed or performed during the hospital encounter of 04/07/15 (from the past 48 hour(s))  Glucose, capillary     Status: Abnormal   Collection Time: 04/07/15  8:04 AM  Result Value Ref Range   Glucose-Capillary 160 (H) 65 - 99 mg/dL   No results found.  Review of Systems  Constitutional: Negative.   HENT: Negative.   Eyes: Negative.   Respiratory: Negative.   Cardiovascular: Negative.   Gastrointestinal: Negative.   Musculoskeletal: Negative.   Skin: Negative.   Neurological: Negative.   Psychiatric/Behavioral: Positive for memory loss. Negative for depression, suicidal ideas, hallucinations and substance abuse. The patient is not nervous/anxious and does not have insomnia.     Blood pressure 149/72, pulse 80, temperature 96.9 F (36.1 C), temperature source Tympanic, resp. rate 18, height 6\' 2"  (1.88 m), weight 86.183 kg (190 lb), SpO2 99 %. Physical Exam    Assessment/Plan We are going to halt index course as of today and scheduled the patient for a next treatment on Wednesday the 19th  Rafe Mackowski 04/07/2015, 10:28 AM

## 2015-04-07 NOTE — Procedures (Signed)
ECT SERVICES Physician's Interval Evaluation & Treatment Note  Patient Identification: Theodore RogueRicky L Butrick MRN:  [id number] Date of Evaluation:  04/07/2015 TX #: 7  MADRS: 22  MMSE: 30  P.E. Findings:  No new physical complaints  Psychiatric Interval Note:  Patient's mood continues to show improvement and appears to have plateaued. No current suicidal ideation. Mild memory loss.  Subjective:  Patient is a 59 y.o. male seen for evaluation for Electroconvulsive Therapy. Patient himself feels like his mood is better and has minimal memory complaint  Treatment Summary:   [x]   Right Unilateral             []  Bilateral   % Energy : 0.3 ms 100%   Impedance: 1410 ohms  Seizure Energy Index: 6292 V squared  Postictal Suppression Index: 92%  Seizure Concordance Index: 97%  Medications  Pre Shock: Xylocaine 4 mg, Toradol 30 mg, Brevital 90 mg, succinylcholine 150 mg  Post Shock:    Seizure Duration: 9 seconds by EMG, 29 seconds by EEG   Comments: Patient will stop index course and will follow-up next Wednesday the 19th for maintenance   Lungs:  [x]   Clear to auscultation               []  Other:   Heart:    [x]   Regular rhythm             []  irregular rhythm    [x]   Previous H&P reviewed, patient examined and there are NO CHANGES                 []   Previous H&P reviewed, patient examined and there are changes noted.   Mordecai RasmussenJohn Clapacs, MD 10/12/201610:29 AM

## 2015-04-07 NOTE — Transfer of Care (Signed)
Immediate Anesthesia Transfer of Care Note  Patient: Theodore Long  Procedure(s) Performed: ECT  Patient Location: PACU  Anesthesia Type:General  Level of Consciousness: awake and patient cooperative  Airway & Oxygen Therapy: Patient Spontanous Breathing and Patient connected to nasal cannula oxygen  Post-op Assessment: Report given to RN  Post vital signs: Reviewed and stable  Last Vitals:  Filed Vitals:   04/07/15 1045  BP: 162/75  Pulse: 103  Temp: 37.7 C  Resp: 21    Complications: No apparent anesthesia complications

## 2015-04-07 NOTE — Anesthesia Procedure Notes (Signed)
Date/Time: 04/07/2015 10:35 AM Performed by: Lily KocherPERALTA, Theodore Wulff Pre-anesthesia Checklist: Patient identified, Emergency Drugs available, Suction available and Patient being monitored Patient Re-evaluated:Patient Re-evaluated prior to inductionOxygen Delivery Method: Circle system utilized Preoxygenation: Pre-oxygenation with 100% oxygen Intubation Type: IV induction Ventilation: Mask ventilation throughout procedure and Oral airway inserted - appropriate to patient size Airway Equipment and Method: Bite block Placement Confirmation: positive ETCO2 Dental Injury: Teeth and Oropharynx as per pre-operative assessment

## 2015-04-08 NOTE — Anesthesia Postprocedure Evaluation (Signed)
  Anesthesia Post-op Note  Patient: Theodore Long  Procedure(s) Performed: * No procedures listed *  Anesthesia type:General  Patient location: PACU  Post pain: Pain level controlled  Post assessment: Post-op Vital signs reviewed, Patient's Cardiovascular Status Stable, Respiratory Function Stable, Patent Airway and No signs of Nausea or vomiting  Post vital signs: Reviewed and stable  Last Vitals:  Filed Vitals:   04/07/15 1115  BP: 162/72  Pulse: 84  Temp:   Resp: 13    Level of consciousness: awake, alert  and patient cooperative  Complications: No apparent anesthesia complications

## 2015-04-14 ENCOUNTER — Encounter: Payer: Self-pay | Admitting: Anesthesiology

## 2015-04-14 ENCOUNTER — Encounter
Admission: RE | Admit: 2015-04-14 | Discharge: 2015-04-14 | Disposition: A | Discharge status: Home / Self Care | Payer: Medicare Other | Source: Ambulatory Visit | Attending: Psychiatry | Admitting: Psychiatry

## 2015-04-14 ENCOUNTER — Encounter: Payer: Medicare Other | Admitting: Anesthesiology

## 2015-04-14 ENCOUNTER — Other Ambulatory Visit: Payer: Self-pay | Admitting: *Deleted

## 2015-04-14 DIAGNOSIS — K219 Gastro-esophageal reflux disease without esophagitis: Secondary | ICD-10-CM | POA: Insufficient documentation

## 2015-04-14 DIAGNOSIS — F332 Major depressive disorder, recurrent severe without psychotic features: Secondary | ICD-10-CM | POA: Diagnosis not present

## 2015-04-14 DIAGNOSIS — R413 Other amnesia: Secondary | ICD-10-CM | POA: Insufficient documentation

## 2015-04-14 DIAGNOSIS — E039 Hypothyroidism, unspecified: Secondary | ICD-10-CM | POA: Insufficient documentation

## 2015-04-14 DIAGNOSIS — E119 Type 2 diabetes mellitus without complications: Secondary | ICD-10-CM | POA: Diagnosis not present

## 2015-04-14 DIAGNOSIS — I10 Essential (primary) hypertension: Secondary | ICD-10-CM | POA: Diagnosis not present

## 2015-04-14 LAB — GLUCOSE, CAPILLARY: GLUCOSE-CAPILLARY: 118 mg/dL — AB (ref 65–99)

## 2015-04-14 MED ORDER — LIDOCAINE HCL (CARDIAC) 20 MG/ML IV SOLN
4.0000 mg | Freq: Once | INTRAVENOUS | Status: AC
  Administered 2015-04-14: 4 mg via INTRAVENOUS

## 2015-04-14 MED ORDER — KETOROLAC TROMETHAMINE 30 MG/ML IJ SOLN
30.0000 mg | Freq: Once | INTRAMUSCULAR | Status: AC
  Administered 2015-04-14: 30 mg via INTRAVENOUS

## 2015-04-14 MED ORDER — SODIUM CHLORIDE 0.9 % IV SOLN
250.0000 mL | Freq: Once | INTRAVENOUS | Status: AC
  Administered 2015-04-14: 250 mL via INTRAVENOUS
  Administered 2015-04-14: 10:00:00 via INTRAVENOUS
  Administered 2015-04-14: 250 mL via INTRAVENOUS

## 2015-04-14 MED ORDER — FENTANYL CITRATE (PF) 100 MCG/2ML IJ SOLN: 25.0000 ug | INTRAMUSCULAR | Status: DC | PRN

## 2015-04-14 MED ORDER — SODIUM CHLORIDE 0.9 % IV SOLN
INTRAVENOUS | Status: DC | PRN
  Administered 2015-04-14: 10:00:00 via INTRAVENOUS

## 2015-04-14 MED ORDER — ONDANSETRON HCL 4 MG/2ML IJ SOLN: 4.0000 mg | Freq: Once | INTRAMUSCULAR | Status: DC | PRN

## 2015-04-14 MED ORDER — SUCCINYLCHOLINE CHLORIDE 20 MG/ML IJ SOLN
150.0000 mg | Freq: Once | INTRAMUSCULAR | Status: AC
  Administered 2015-04-14: 150 mg via INTRAVENOUS

## 2015-04-14 MED ORDER — METHOHEXITAL SODIUM 100 MG/10ML IV SOSY
90.0000 mg | PREFILLED_SYRINGE | Freq: Once | INTRAVENOUS | Status: AC
  Administered 2015-04-14: 90 mg via INTRAVENOUS

## 2015-04-14 NOTE — Anesthesia Preprocedure Evaluation (Signed)
Anesthesia Evaluation  Patient identified by MRN, date of birth, ID band Patient awake    Reviewed: Allergy & Precautions, NPO status , Patient's Chart, lab work & pertinent test results  Airway Mallampati: II  TM Distance: >3 FB Neck ROM: Limited    Dental  (+) Poor Dentition   Pulmonary neg pulmonary ROS,    Pulmonary exam normal        Cardiovascular hypertension, Pt. on medications Normal cardiovascular exam     Neuro/Psych PSYCHIATRIC DISORDERS Depression Bipolar Disorder negative neurological ROS     GI/Hepatic Neg liver ROS, GERD  Medicated and Controlled,  Endo/Other  diabetes, Type 2Hypothyroidism BG 151.  Renal/GU negative Renal ROS  negative genitourinary   Musculoskeletal negative musculoskeletal ROS (+)   Abdominal (+)  Abdomen: soft.    Peds negative pediatric ROS (+)  Hematology negative hematology ROS (+)   Anesthesia Other Findings   Reproductive/Obstetrics                             Anesthesia Physical  Anesthesia Plan  ASA: III  Anesthesia Plan: General   Post-op Pain Management:    Induction: Intravenous  Airway Management Planned: Mask  Additional Equipment:   Intra-op Plan:   Post-operative Plan:   Informed Consent: I have reviewed the patients History and Physical, chart, labs and discussed the procedure including the risks, benefits and alternatives for the proposed anesthesia with the patient or authorized representative who has indicated his/her understanding and acceptance.     Plan Discussed with: CRNA  Anesthesia Plan Comments:         Anesthesia Quick Evaluation  

## 2015-04-14 NOTE — Procedures (Signed)
ECT SERVICES Physician's Interval Evaluation & Treatment Note  Patient Identification: Theodore Long MRN:  [id number] Date of Evaluation:  04/14/2015 TX #: 8  MADRS:   MMSE:   P.E. Findings:  No new physical complaints or changes to exam  Psychiatric Interval Note:  Mood remains improved memory problems are getting better  Subjective:  Patient is a 59 y.o. male seen for evaluation for Electroconvulsive Therapy. Has had a headache for the last couple days.  Treatment Summary:   [x]   Right Unilateral             []  Bilateral   % Energy : 0.3 ms, 100%   Impedance: 1200 ohms  Seizure Energy Index: Not really red.  Postictal Suppression Index: Not red  Seizure Concordance Index: 33%  Medications  Pre Shock: Xylocaine 4 mg, Toradol 30 mg, Brevital 90 mg, succinylcholine 150 mg  Post Shock: None  Seizure Duration: 9 seconds by EMG, EEG was very difficult to read the ending of. He definitely had a seizure and I would say it went at least out 27 seconds but the endpoint was unusually vague   Comments: Because of a poor quality seizure I'm going to suggest we change his primary and aesthetic to ketamine 100 mg for next treatment. Follow-up maintenance in one week on October 26   Lungs:  [x]   Clear to auscultation               []  Other:   Heart:    [x]   Regular rhythm             []  irregular rhythm    [x]   Previous H&P reviewed, patient examined and there are NO CHANGES                 []   Previous H&P reviewed, patient examined and there are changes noted.   Mordecai RasmussenJohn Gemini Beaumier, MD 10/19/201610:32 AM

## 2015-04-14 NOTE — H&P (Signed)
Theodore Long is an 59 y.o. male.   Chief Complaint: No new complaint. Mood remains stable. Memory problems slightly improved. HPI: Patient with major depression receiving ECT currently moving into maintenance phase.  Past Medical History  Diagnosis Date  . Diabetes mellitus without complication (HCC)   . Hypothyroidism   . GERD (gastroesophageal reflux disease)   . Hypertension   . Bipolar affect, depressed (HCC)     Past Surgical History  Procedure Laterality Date  . Elbow arthroplasty Left   . Ankle fracture surgery    . Closed reduction hand fracture    . Dupuytren / palmar fasciotomy Left     Family History  Problem Relation Age of Onset  . Cancer Mother   . COPD Father    Social History:  reports that he has never smoked. He does not have any smokeless tobacco history on file. He reports that he does not drink alcohol or use illicit drugs.  Allergies:  Allergies  Allergen Reactions  . Bee Venom Shortness Of Breath and Swelling     (Not in a hospital admission)  Results for orders placed or performed during the hospital encounter of 04/14/15 (from the past 48 hour(s))  Glucose, capillary     Status: Abnormal   Collection Time: 04/14/15  8:17 AM  Result Value Ref Range   Glucose-Capillary 118 (H) 65 - 99 mg/dL   No results found.  Review of Systems  Constitutional: Negative.   HENT: Negative.   Eyes: Negative.   Respiratory: Negative.   Cardiovascular: Negative.   Gastrointestinal: Negative.   Musculoskeletal: Negative.   Skin: Negative.   Neurological: Negative.   Psychiatric/Behavioral: Positive for memory loss. Negative for depression, suicidal ideas, hallucinations and substance abuse. The patient is not nervous/anxious and does not have insomnia.     Blood pressure 159/79, pulse 93, temperature 96.9 F (36.1 C), temperature source Tympanic, resp. rate 16, weight 87.544 kg (193 lb). Physical Exam  Nursing note and vitals  reviewed. Constitutional: He appears well-developed and well-nourished.  HENT:  Head: Normocephalic and atraumatic.  Eyes: Conjunctivae are normal. Pupils are equal, round, and reactive to light.  Neck: Normal range of motion.  Cardiovascular: Normal rate, regular rhythm and normal heart sounds.   Respiratory: Effort normal and breath sounds normal. No respiratory distress. He has no wheezes. He has no rales.  GI: Soft.  Musculoskeletal: Normal range of motion.  Neurological: He is alert.  Skin: Skin is warm and dry.  Psychiatric: He has a normal mood and affect. His behavior is normal. Judgment and thought content normal.     Assessment/Plan Continue with maintenance phase by scheduling treatment for 1 week  John Clapacs 04/14/2015, 10:30 AM

## 2015-04-14 NOTE — Anesthesia Postprocedure Evaluation (Signed)
  Anesthesia Post-op Note  Patient: Theodore HarpsRicky L Lovett  Procedure(s) Performed: * No procedures listed *  Anesthesia type:General  Patient location: PACU  Post pain: Pain level controlled  Post assessment: Post-op Vital signs reviewed, Patient's Cardiovascular Status Stable, Respiratory Function Stable, Patent Airway and No signs of Nausea or vomiting  Post vital signs: Reviewed and stable  Last Vitals:  Filed Vitals:   04/14/15 1127  BP: 134/75  Pulse: 83  Temp: 36.7 C  Resp: 16    Level of consciousness: awake, alert  and patient cooperative  Complications: No apparent anesthesia complications

## 2015-04-14 NOTE — Transfer of Care (Signed)
Immediate Anesthesia Transfer of Care Note  Patient: Theodore Long    Procedure(s) Performed: * No procedures listed *  Patient Location: PACU  Anesthesia Type:General  Level of Consciousness: awake, alert  and oriented  Airway & Oxygen Therapy: Patient Spontanous Breathing and Patient connected to face mask oxygen  Post-op Assessment: Report given to RN and Post -op Vital signs reviewed and stable  Post vital signs: Reviewed and stable  Last Vitals:  Filed Vitals:   04/14/15 1050  BP: 146/79  Pulse: 90  Temp:   Resp: 12    Complications: No apparent anesthesia complications

## 2015-04-21 ENCOUNTER — Encounter: Payer: Self-pay | Admitting: Anesthesiology

## 2015-04-21 ENCOUNTER — Encounter (HOSPITAL_BASED_OUTPATIENT_CLINIC_OR_DEPARTMENT_OTHER)
Admission: RE | Admit: 2015-04-21 | Discharge: 2015-04-21 | Disposition: A | Discharge status: Home / Self Care | Payer: Medicare Other | Source: Ambulatory Visit | Attending: Psychiatry | Admitting: Psychiatry

## 2015-04-21 DIAGNOSIS — F332 Major depressive disorder, recurrent severe without psychotic features: Secondary | ICD-10-CM | POA: Diagnosis not present

## 2015-04-21 LAB — GLUCOSE, CAPILLARY: Glucose-Capillary: 200 mg/dL — ABNORMAL HIGH (ref 65–99)

## 2015-04-21 MED ORDER — KETAMINE HCL 10 MG/ML IJ SOLN
100.0000 mg | Freq: Once | INTRAMUSCULAR | Status: AC
  Administered 2015-04-21: 100 mg via INTRAVENOUS

## 2015-04-21 MED ORDER — SUCCINYLCHOLINE CHLORIDE 20 MG/ML IJ SOLN
150.0000 mg | Freq: Once | INTRAMUSCULAR | Status: AC
  Administered 2015-04-21: 150 mg via INTRAVENOUS

## 2015-04-21 MED ORDER — LIDOCAINE HCL (CARDIAC) 20 MG/ML IV SOLN
4.0000 mg | Freq: Once | INTRAVENOUS | Status: AC
  Administered 2015-04-21: 4 mg via INTRAVENOUS

## 2015-04-21 MED ORDER — FENTANYL CITRATE (PF) 100 MCG/2ML IJ SOLN: 25.0000 ug | INTRAMUSCULAR | Status: DC | PRN

## 2015-04-21 MED ORDER — ONDANSETRON HCL 4 MG/2ML IJ SOLN: 4.0000 mg | Freq: Once | INTRAMUSCULAR | Status: DC | PRN

## 2015-04-21 MED ORDER — SODIUM CHLORIDE 0.9 % IV SOLN
250.0000 mL | Freq: Once | INTRAVENOUS | Status: AC
  Administered 2015-04-21: 250 mL via INTRAVENOUS

## 2015-04-21 MED ORDER — KETOROLAC TROMETHAMINE 30 MG/ML IJ SOLN
30.0000 mg | Freq: Once | INTRAMUSCULAR | Status: AC
  Administered 2015-04-21: 30 mg via INTRAVENOUS

## 2015-04-21 NOTE — Transfer of Care (Signed)
Immediate Anesthesia Transfer of Care Note  Patient: Theodore Long  Procedure(s) Performed: ECT  Patient Location: PACU  Anesthesia Type:General  Level of Consciousness: sedated  Airway & Oxygen Therapy: Patient Spontanous Breathing and Patient connected to face mask oxygen  Post-op Assessment: Report given to RN and Post -op Vital signs reviewed and stable  Post vital signs: Reviewed and stable  Last Vitals:  Filed Vitals:   04/21/15 0830  BP: 150/73  Pulse: 73  Temp: 36.5 C    Complications: No apparent anesthesia complications

## 2015-04-21 NOTE — H&P (Signed)
Theodore Long is an 59 y.o. male.   Chief Complaint: Patient with severe depression who has shown great response and is now going into maintenance phase. No new complaints HPI: No significant change since last treatment  Past Medical History  Diagnosis Date  . Diabetes mellitus without complication (HCC)   . Hypothyroidism   . GERD (gastroesophageal reflux disease)   . Hypertension   . Bipolar affect, depressed (HCC)     Past Surgical History  Procedure Laterality Date  . Elbow arthroplasty Left   . Ankle fracture surgery    . Closed reduction hand fracture    . Dupuytren / palmar fasciotomy Left     Family History  Problem Relation Age of Onset  . Cancer Mother   . COPD Father    Social History:  reports that he has never smoked. He does not have any smokeless tobacco history on file. He reports that he does not drink alcohol or use illicit drugs.  Allergies:  Allergies  Allergen Reactions  . Bee Venom Shortness Of Breath and Swelling     (Not in a hospital admission)  Results for orders placed or performed during the hospital encounter of 04/21/15 (from the past 48 hour(s))  Glucose, capillary     Status: Abnormal   Collection Time: 04/21/15  8:24 AM  Result Value Ref Range   Glucose-Capillary 200 (H) 65 - 99 mg/dL   Comment 1 Notify RN    No results found.  Review of Systems  Constitutional: Negative.   HENT: Negative.   Eyes: Negative.   Respiratory: Negative.   Cardiovascular: Negative.   Gastrointestinal: Negative.   Musculoskeletal: Negative.   Skin: Negative.   Neurological: Negative.   Psychiatric/Behavioral: Negative for depression, suicidal ideas, hallucinations, memory loss and substance abuse. The patient is not nervous/anxious and does not have insomnia.     Blood pressure 150/73, pulse 73, temperature 97.7 F (36.5 C), temperature source Oral, height 6\' 2"  (1.88 m), weight 89.585 kg (197 lb 8 oz), SpO2 99 %. Physical Exam  Constitutional:  He appears well-developed and well-nourished.  HENT:  Head: Normocephalic and atraumatic.  Eyes: Conjunctivae are normal. Pupils are equal, round, and reactive to light.  Neck: Normal range of motion.  Cardiovascular: Normal rate, regular rhythm and normal heart sounds.   Respiratory: Effort normal and breath sounds normal. No respiratory distress. He has no wheezes.  GI: Soft.  Musculoskeletal: Normal range of motion.  Neurological: He is alert.  Skin: Skin is warm and dry.  Psychiatric: He has a normal mood and affect. His speech is normal and behavior is normal. Judgment and thought content normal. Cognition and memory are normal.     Assessment/Plan Planned for 2 week follow-up as patient appears to be stabilizing. No other change to treatment plan.  Theodore Long 04/21/2015, 10:16 AM

## 2015-04-21 NOTE — Anesthesia Postprocedure Evaluation (Signed)
  Anesthesia Post-op Note  Patient: Theodore Long  Procedure(s) Performed: * No procedures listed *  Anesthesia type:General  Patient location: PACU  Post pain: Pain level controlled  Post assessment: Post-op Vital signs reviewed, Patient's Cardiovascular Status Stable, Respiratory Function Stable, Patent Airway and No signs of Nausea or vomiting  Post vital signs: Reviewed and stable  Last Vitals:  Filed Vitals:   04/21/15 1123  BP: 149/79  Pulse: 80  Temp: 36.6 C  Resp: 18    Level of consciousness: awake, alert  and patient cooperative  Complications: No apparent anesthesia complications

## 2015-04-21 NOTE — Anesthesia Procedure Notes (Signed)
Date/Time: 04/21/2015 10:15 AM Performed by: Junious SilkNOLES, Ishia Tenorio Pre-anesthesia Checklist: Patient identified, Timeout performed, Emergency Drugs available, Suction available and Patient being monitored Patient Re-evaluated:Patient Re-evaluated prior to inductionOxygen Delivery Method: Simple face mask Preoxygenation: Pre-oxygenation with 100% oxygen Ventilation: Mask ventilation throughout procedure

## 2015-04-21 NOTE — Procedures (Signed)
ECT SERVICES Physician's Interval Evaluation & Treatment Note  Patient Identification: Theodore Long MRN:  [id number] Date of Evaluation:  04/21/2015 TX #: 9  MADRS:   MMSE:   P.E. Findings:  No new physical complaints.  Psychiatric Interval Note:  Mood continues improved no return of depressive symptoms. No current memory complaints  Subjective:  Patient is a 59 y.o. male seen for evaluation for Electroconvulsive Therapy. Patient appears to be doing well in stabilizing plans for follow-up in 2 weeks  Treatment Summary:   [x]   Right Unilateral             []  Bilateral   % Energy : 0.3 ms 100%   Impedance: 1400 ohms  Seizure Energy Index: 11,622 V squared  Postictal Suppression Index: 95%  Seizure Concordance Index: 97%  Medications  Pre Shock: Xylocaine 4 mg, Toradol 30 mg, ketamine 100 mg, succinylcholine 150 mg  Post Shock: None  Seizure Duration: 18 seconds by EMG, 38 seconds by EEG   Comments: Patient is in maintenance phase and we have planned on a follow-up in 2 weeks on November 9 with further tapering as tolerated. No other change to plan. I will give a call to his primary psychiatrist to update her.   Lungs:  [x]   Clear to auscultation               []  Other:   Heart:    [x]   Regular rhythm             []  irregular rhythm    [x]   Previous H&P reviewed, patient examined and there are NO CHANGES                 []   Previous H&P reviewed, patient examined and there are changes noted.   Mordecai RasmussenJohn Clapacs, MD 10/26/201610:18 AM

## 2015-04-21 NOTE — Anesthesia Preprocedure Evaluation (Signed)
Anesthesia Evaluation  Patient identified by MRN, date of birth, ID band Patient awake    Reviewed: Allergy & Precautions, NPO status   Airway Mallampati: II       Dental no notable dental hx.    Pulmonary neg pulmonary ROS,    Pulmonary exam normal        Cardiovascular hypertension, Pt. on medications Normal cardiovascular exam     Neuro/Psych Depression Bipolar Disorder    GI/Hepatic Neg liver ROS, GERD  ,  Endo/Other  diabetes, Type 2, Oral Hypoglycemic AgentsHypothyroidism   Renal/GU negative Renal ROS     Musculoskeletal   Abdominal Normal abdominal exam  (+)   Peds  Hematology   Anesthesia Other Findings   Reproductive/Obstetrics                             Anesthesia Physical Anesthesia Plan  ASA: II  Anesthesia Plan: General   Post-op Pain Management:    Induction: Intravenous  Airway Management Planned: Mask  Additional Equipment:   Intra-op Plan:   Post-operative Plan:   Informed Consent: I have reviewed the patients History and Physical, chart, labs and discussed the procedure including the risks, benefits and alternatives for the proposed anesthesia with the patient or authorized representative who has indicated his/her understanding and acceptance.     Plan Discussed with: CRNA  Anesthesia Plan Comments:         Anesthesia Quick Evaluation

## 2015-04-23 NOTE — Addendum Note (Signed)
Addendum  created 04/23/15 1708 by Lezlie OctaveGijsbertus F Van Staveren, MD   Modules edited: Anesthesia Attestations

## 2015-04-28 ENCOUNTER — Other Ambulatory Visit: Payer: Self-pay | Admitting: *Deleted

## 2015-05-05 ENCOUNTER — Encounter: Payer: Self-pay | Admitting: Anesthesiology

## 2015-05-05 ENCOUNTER — Encounter
Admission: RE | Admit: 2015-05-05 | Discharge: 2015-05-05 | Disposition: A | Discharge status: Home / Self Care | Payer: Medicare Other | Source: Ambulatory Visit | Attending: Psychiatry | Admitting: Psychiatry

## 2015-05-05 ENCOUNTER — Inpatient Hospital Stay: Admission: RE | Admit: 2015-05-05 | Payer: Medicare Other | Source: Ambulatory Visit

## 2015-05-05 DIAGNOSIS — F319 Bipolar disorder, unspecified: Secondary | ICD-10-CM | POA: Insufficient documentation

## 2015-05-05 DIAGNOSIS — F332 Major depressive disorder, recurrent severe without psychotic features: Secondary | ICD-10-CM | POA: Diagnosis not present

## 2015-05-05 DIAGNOSIS — E119 Type 2 diabetes mellitus without complications: Secondary | ICD-10-CM | POA: Insufficient documentation

## 2015-05-05 DIAGNOSIS — K219 Gastro-esophageal reflux disease without esophagitis: Secondary | ICD-10-CM | POA: Insufficient documentation

## 2015-05-05 DIAGNOSIS — E039 Hypothyroidism, unspecified: Secondary | ICD-10-CM | POA: Insufficient documentation

## 2015-05-05 DIAGNOSIS — I10 Essential (primary) hypertension: Secondary | ICD-10-CM | POA: Diagnosis not present

## 2015-05-05 LAB — GLUCOSE, CAPILLARY: Glucose-Capillary: 184 mg/dL — ABNORMAL HIGH (ref 65–99)

## 2015-05-05 MED ORDER — LIDOCAINE HCL (CARDIAC) 20 MG/ML IV SOLN
4.0000 mg | Freq: Once | INTRAVENOUS | Status: AC
  Administered 2015-05-05: 4 mg via INTRAVENOUS

## 2015-05-05 MED ORDER — FENTANYL CITRATE (PF) 100 MCG/2ML IJ SOLN: 25.0000 ug | INTRAMUSCULAR | Status: DC | PRN

## 2015-05-05 MED ORDER — KETOROLAC TROMETHAMINE 30 MG/ML IJ SOLN
30.0000 mg | Freq: Once | INTRAMUSCULAR | Status: AC
  Administered 2015-05-05: 30 mg via INTRAVENOUS

## 2015-05-05 MED ORDER — KETAMINE HCL 10 MG/ML IJ SOLN
100.0000 mg | Freq: Once | INTRAMUSCULAR | Status: AC
  Administered 2015-05-05: 100 mg via INTRAVENOUS

## 2015-05-05 MED ORDER — SODIUM CHLORIDE 0.9 % IV SOLN
250.0000 mL | Freq: Once | INTRAVENOUS | Status: AC
  Administered 2015-05-05: 250 mL via INTRAVENOUS

## 2015-05-05 MED ORDER — ONDANSETRON HCL 4 MG/2ML IJ SOLN: 4.0000 mg | Freq: Once | INTRAMUSCULAR | Status: DC | PRN

## 2015-05-05 MED ORDER — SUCCINYLCHOLINE CHLORIDE 20 MG/ML IJ SOLN
150.0000 mg | Freq: Once | INTRAMUSCULAR | Status: AC
  Administered 2015-05-05: 150 mg via INTRAVENOUS

## 2015-05-05 NOTE — Anesthesia Procedure Notes (Signed)
Date/Time: 05/05/2015 11:10 AM Performed by: Lily KocherPERALTA, Maronda Caison Pre-anesthesia Checklist: Patient identified, Emergency Drugs available, Suction available and Patient being monitored Patient Re-evaluated:Patient Re-evaluated prior to inductionOxygen Delivery Method: Circle system utilized Preoxygenation: Pre-oxygenation with 100% oxygen Intubation Type: IV induction Ventilation: Mask ventilation without difficulty and Mask ventilation throughout procedure Airway Equipment and Method: Bite block Placement Confirmation: positive ETCO2 Dental Injury: Teeth and Oropharynx as per pre-operative assessment

## 2015-05-05 NOTE — H&P (Signed)
Ida RogueRicky L Rebuck is an 59 y.o. male.   Chief Complaint: Patient continues to have some memory impairment but mood is much better no new physical complaints HPI: Since last treatment has not had any return of depression no new complaints  Past Medical History  Diagnosis Date  . Diabetes mellitus without complication (HCC)   . Hypothyroidism   . GERD (gastroesophageal reflux disease)   . Hypertension   . Bipolar affect, depressed (HCC)     Past Surgical History  Procedure Laterality Date  . Elbow arthroplasty Left   . Ankle fracture surgery    . Closed reduction hand fracture    . Dupuytren / palmar fasciotomy Left     Family History  Problem Relation Age of Onset  . Cancer Mother   . COPD Father    Social History:  reports that he has never smoked. He does not have any smokeless tobacco history on file. He reports that he does not drink alcohol or use illicit drugs.  Allergies:  Allergies  Allergen Reactions  . Bee Venom Shortness Of Breath and Swelling     (Not in a hospital admission)  Results for orders placed or performed during the hospital encounter of 05/05/15 (from the past 48 hour(s))  Glucose, capillary     Status: Abnormal   Collection Time: 05/05/15  9:22 AM  Result Value Ref Range   Glucose-Capillary 184 (H) 65 - 99 mg/dL   Comment 1 Notify RN    No results found.  Review of Systems  Constitutional: Negative.   HENT: Negative.   Eyes: Negative.   Respiratory: Negative.   Cardiovascular: Negative.   Gastrointestinal: Negative.   Musculoskeletal: Negative.   Skin: Negative.   Neurological: Negative.   Psychiatric/Behavioral: Positive for memory loss. Negative for depression, suicidal ideas, hallucinations and substance abuse. The patient is not nervous/anxious and does not have insomnia.     Blood pressure 134/67, pulse 76, temperature 97.7 F (36.5 C), temperature source Oral, resp. rate 18, weight 89.359 kg (197 lb), SpO2 99 %. Physical Exam   Nursing note and vitals reviewed. Constitutional: He appears well-developed and well-nourished.  HENT:  Head: Normocephalic and atraumatic.  Eyes: Conjunctivae are normal. Pupils are equal, round, and reactive to light.  Neck: Normal range of motion.  Cardiovascular: Normal rate, regular rhythm and normal heart sounds.   Respiratory: Effort normal and breath sounds normal. No respiratory distress. He has no wheezes.  GI: Soft.  Musculoskeletal: Normal range of motion.  Neurological: He is alert.  Skin: Skin is warm and dry.  Psychiatric: He has a normal mood and affect. His behavior is normal. Judgment and thought content normal.     Assessment/Plan Continue maintenance with follow-up treatment in 2 weeks on November 23  Zakayla Martinec 05/05/2015, 11:03 AM

## 2015-05-05 NOTE — Transfer of Care (Signed)
Immediate Anesthesia Transfer of Care Note  Patient: Theodore Long  Procedure(s) Performed: ECT  Patient Location: PACU  Anesthesia Type:General  Level of Consciousness: sedated  Airway & Oxygen Therapy: Patient Spontanous Breathing and Patient connected to face mask oxygen  Post-op Assessment: Report given to RN and Post -op Vital signs reviewed and stable  Post vital signs: Reviewed and stable  Last Vitals:  Filed Vitals:   05/05/15 1121  BP: 156/77  Pulse:   Temp: 37.1 C  Resp: 14    Complications: No apparent anesthesia complications

## 2015-05-05 NOTE — Procedures (Signed)
ECT SERVICES Physician's Interval Evaluation & Treatment Note  Patient Identification: Theodore Long MRN:  [id number] Date of Evaluation:  05/05/2015 TX #: 10  MADRS:   MMSE:   P.E. Findings:  No new physical findings since last time no new complaints  Psychiatric Interval Note:  Mood continues to be doing well. Some memory impairment is lingering.  Subjective:  Patient is a 59 y.o. male seen for evaluation for Electroconvulsive Therapy. No new complaints  Treatment Summary:   [x]   Right Unilateral             []  Bilateral   % Energy : 0.3 ms 100%   Impedance: 960 ohms  Seizure Energy Index: 22,783 V squared  Postictal Suppression Index: 68%  Seizure Concordance Index: 99%  Medications  Pre Shock: Xylocaine 4 mg, Toradol 30 mg, ketamine 100 mg succinylcholine 150 mg  Post Shock:    Seizure Duration: 24 seconds by EMG, 56 seconds by EEG   Comments: Follow-up 2 weeks November 23   Lungs:  [x]   Clear to auscultation               []  Other:   Heart:    [x]   Regular rhythm             []  irregular rhythm    [x]   Previous H&P reviewed, patient examined and there are NO CHANGES                 []   Previous H&P reviewed, patient examined and there are changes noted.   Mordecai RasmussenJohn Jeany Seville, MD 11/9/201611:04 AM

## 2015-05-05 NOTE — Anesthesia Postprocedure Evaluation (Signed)
  Anesthesia Post-op Note  Patient: Theodore HarpsRicky L Hakanson  Procedure(s) Performed: * No procedures listed *  Anesthesia type:General  Patient location: PACU  Post pain: Pain level controlled  Post assessment: Post-op Vital signs reviewed, Patient's Cardiovascular Status Stable, Respiratory Function Stable, Patent Airway and No signs of Nausea or vomiting  Post vital signs: Reviewed and stable  Last Vitals:  Filed Vitals:   05/05/15 1204  BP: 111/93  Pulse: 92  Temp:   Resp: 18    Level of consciousness: awake, alert  and patient cooperative  Complications: No apparent anesthesia complications

## 2015-05-05 NOTE — Anesthesia Preprocedure Evaluation (Signed)
Anesthesia Evaluation  Patient identified by MRN, date of birth, ID band Patient awake    Reviewed: Allergy & Precautions, NPO status , Patient's Chart, lab work & pertinent test results  Airway Mallampati: II  TM Distance: >3 FB Neck ROM: Limited    Dental  (+) Poor Dentition   Pulmonary neg pulmonary ROS,    Pulmonary exam normal        Cardiovascular hypertension, Pt. on medications Normal cardiovascular exam     Neuro/Psych PSYCHIATRIC DISORDERS Depression Bipolar Disorder negative neurological ROS     GI/Hepatic Neg liver ROS, GERD  Medicated and Controlled,  Endo/Other  diabetes, Type 2Hypothyroidism BG 151.  Renal/GU negative Renal ROS  negative genitourinary   Musculoskeletal negative musculoskeletal ROS (+)   Abdominal (+)  Abdomen: soft.    Peds negative pediatric ROS (+)  Hematology negative hematology ROS (+)   Anesthesia Other Findings   Reproductive/Obstetrics                             Anesthesia Physical  Anesthesia Plan  ASA: III  Anesthesia Plan: General   Post-op Pain Management:    Induction: Intravenous  Airway Management Planned: Mask  Additional Equipment:   Intra-op Plan:   Post-operative Plan:   Informed Consent: I have reviewed the patients History and Physical, chart, labs and discussed the procedure including the risks, benefits and alternatives for the proposed anesthesia with the patient or authorized representative who has indicated his/her understanding and acceptance.     Plan Discussed with: CRNA  Anesthesia Plan Comments:         Anesthesia Quick Evaluation

## 2015-05-19 ENCOUNTER — Encounter: Payer: Self-pay | Admitting: Certified Registered Nurse Anesthetist

## 2015-05-19 ENCOUNTER — Encounter
Admission: RE | Admit: 2015-05-19 | Discharge: 2015-05-19 | Disposition: A | Discharge status: Home / Self Care | Payer: Medicare Other | Source: Ambulatory Visit | Attending: Psychiatry | Admitting: Psychiatry

## 2015-05-19 DIAGNOSIS — I10 Essential (primary) hypertension: Secondary | ICD-10-CM | POA: Insufficient documentation

## 2015-05-19 DIAGNOSIS — Z91030 Bee allergy status: Secondary | ICD-10-CM | POA: Insufficient documentation

## 2015-05-19 DIAGNOSIS — F332 Major depressive disorder, recurrent severe without psychotic features: Secondary | ICD-10-CM | POA: Insufficient documentation

## 2015-05-19 DIAGNOSIS — K219 Gastro-esophageal reflux disease without esophagitis: Secondary | ICD-10-CM | POA: Diagnosis not present

## 2015-05-19 DIAGNOSIS — F3189 Other bipolar disorder: Secondary | ICD-10-CM | POA: Insufficient documentation

## 2015-05-19 DIAGNOSIS — E119 Type 2 diabetes mellitus without complications: Secondary | ICD-10-CM | POA: Diagnosis not present

## 2015-05-19 DIAGNOSIS — E039 Hypothyroidism, unspecified: Secondary | ICD-10-CM | POA: Diagnosis not present

## 2015-05-19 LAB — GLUCOSE, CAPILLARY: Glucose-Capillary: 136 mg/dL — ABNORMAL HIGH (ref 65–99)

## 2015-05-19 MED ORDER — FENTANYL CITRATE (PF) 100 MCG/2ML IJ SOLN
25.0000 ug | INTRAMUSCULAR | Status: DC | PRN
Start: 2015-05-19 — End: 2015-05-20

## 2015-05-19 MED ORDER — SUCCINYLCHOLINE CHLORIDE 20 MG/ML IJ SOLN
150.0000 mg | Freq: Once | INTRAMUSCULAR | Status: AC
  Administered 2015-05-19: 150 mg via INTRAVENOUS

## 2015-05-19 MED ORDER — KETOROLAC TROMETHAMINE 30 MG/ML IJ SOLN
30.0000 mg | Freq: Once | INTRAMUSCULAR | Status: AC
  Administered 2015-05-19: 30 mg via INTRAVENOUS

## 2015-05-19 MED ORDER — SODIUM CHLORIDE 0.9 % IV SOLN
250.0000 mL | Freq: Once | INTRAVENOUS | Status: AC
Start: 2015-05-19 — End: 2015-05-19
  Administered 2015-05-19: 250 mL via INTRAVENOUS

## 2015-05-19 MED ORDER — KETAMINE HCL 10 MG/ML IJ SOLN
100.0000 mg | Freq: Once | INTRAMUSCULAR | Status: AC
  Administered 2015-05-19: 100 mg via INTRAVENOUS

## 2015-05-19 MED ORDER — LIDOCAINE HCL (CARDIAC) 20 MG/ML IV SOLN
4.0000 mg | Freq: Once | INTRAVENOUS | Status: AC
  Administered 2015-05-19: 4 mg via INTRAVENOUS

## 2015-05-19 MED ORDER — ONDANSETRON HCL 4 MG/2ML IJ SOLN: 4.0000 mg | Freq: Once | INTRAMUSCULAR | Status: DC | PRN

## 2015-05-19 NOTE — Discharge Instructions (Signed)
Nov. 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 Otherwise ok to return to regular duty.  Dudley Mages T Turrell Severt M.D.

## 2015-05-19 NOTE — Anesthesia Postprocedure Evaluation (Signed)
Anesthesia Post Note  Patient: Theodore Long  Procedure(s) Performed: * No procedures listed *  Anesthesia Type: General Pain management: pain level controlled Respiratory status: spontaneous breathing Cardiovascular status: stable Postop Assessment: No headache Anesthetic complications: no    Last Vitals:  Filed Vitals:   05/19/15 1119 05/19/15 1129  BP: 158/71 147/72  Pulse: 92 89  Temp:    Resp: 17 18    Last Pain: There were no vitals filed for this visit.               Janel Beane S

## 2015-05-19 NOTE — H&P (Signed)
Theodore Long is an 59 y.o. male.   Chief Complaint: Patient reports that his mood has remained good. No new complaints HPI: Maintenance ECT treatment. Doing well without any return of depression  Past Medical History  Diagnosis Date  . Diabetes mellitus without complication (HCC)   . Hypothyroidism   . GERD (gastroesophageal reflux disease)   . Hypertension   . Bipolar affect, depressed (HCC)     Past Surgical History  Procedure Laterality Date  . Elbow arthroplasty Left   . Ankle fracture surgery    . Closed reduction hand fracture    . Dupuytren / palmar fasciotomy Left     Family History  Problem Relation Age of Onset  . Cancer Mother   . COPD Father    Social History:  reports that he has never smoked. He does not have any smokeless tobacco history on file. He reports that he does not drink alcohol or use illicit drugs.  Allergies:  Allergies  Allergen Reactions  . Bee Venom Shortness Of Breath and Swelling     (Not in a hospital admission)  Results for orders placed or performed during the hospital encounter of 05/19/15 (from the past 48 hour(s))  Glucose, capillary     Status: Abnormal   Collection Time: 05/19/15  8:41 AM  Result Value Ref Range   Glucose-Capillary 136 (H) 65 - 99 mg/dL   Comment 1 Notify RN    No results found.  Review of Systems  Constitutional: Negative.   HENT: Negative.   Eyes: Negative.   Respiratory: Negative.   Cardiovascular: Negative.   Gastrointestinal: Negative.   Musculoskeletal: Negative.   Skin: Negative.   Neurological: Negative.   Psychiatric/Behavioral: Negative for depression, suicidal ideas, hallucinations, memory loss and substance abuse. The patient is not nervous/anxious and does not have insomnia.     Blood pressure 156/70, pulse 83, temperature 96.4 F (35.8 C), resp. rate 18, height 6\' 2"  (1.88 m), weight 89.359 kg (197 lb), SpO2 100 %. Physical Exam  Nursing note and vitals reviewed. Constitutional: He  appears well-developed and well-nourished.  HENT:  Head: Normocephalic and atraumatic.  Eyes: Conjunctivae are normal. Pupils are equal, round, and reactive to light.  Neck: Normal range of motion.  Cardiovascular: Normal rate, regular rhythm and normal heart sounds.   Respiratory: Effort normal and breath sounds normal. No respiratory distress.  GI: Soft.  Musculoskeletal: Normal range of motion.  Neurological: He is alert.  Skin: Skin is warm and dry.  Psychiatric: He has a normal mood and affect. His behavior is normal. Judgment and thought content normal.     Assessment/Plan Follow-up in 3 weeks on December 14  Theodore Long 05/19/2015, 10:31 AM

## 2015-05-19 NOTE — Transfer of Care (Signed)
Immediate Anesthesia Transfer of Care Note  Patient: Theodore Long  Procedure(s) Performed: * No procedures listed *  Patient Location: PACU  Anesthesia Type:General  Level of Consciousness: sedated  Airway & Oxygen Therapy: Patient Spontanous Breathing and Patient connected to face mask oxygen  Post-op Assessment: Report given to RN and Post -op Vital signs reviewed and stable  Post vital signs: Reviewed and stable  Last Vitals:  Filed Vitals:   05/19/15 0835  BP: 156/70  Pulse: 83  Temp: 35.8 C  Resp: 18    Complications: No apparent anesthesia complications

## 2015-05-19 NOTE — Discharge Summary (Signed)
  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  John T Clapacs M.D.

## 2015-05-19 NOTE — Anesthesia Preprocedure Evaluation (Signed)
Anesthesia Evaluation  Patient identified by MRN, date of birth, ID band Patient awake    Reviewed: Allergy & Precautions, NPO status , Patient's Chart, lab work & pertinent test results  Airway Mallampati: II  TM Distance: >3 FB Neck ROM: Limited    Dental  (+) Poor Dentition   Pulmonary neg pulmonary ROS,    Pulmonary exam normal        Cardiovascular hypertension, Pt. on medications Normal cardiovascular exam     Neuro/Psych PSYCHIATRIC DISORDERS Depression Bipolar Disorder negative neurological ROS     GI/Hepatic Neg liver ROS, GERD  Medicated and Controlled,  Endo/Other  diabetes, Type 2Hypothyroidism BG 151.  Renal/GU negative Renal ROS  negative genitourinary   Musculoskeletal negative musculoskeletal ROS (+)   Abdominal (+)  Abdomen: soft.    Peds negative pediatric ROS (+)  Hematology negative hematology ROS (+)   Anesthesia Other Findings   Reproductive/Obstetrics                             Anesthesia Physical Anesthesia Plan  ASA: III  Anesthesia Plan: General   Post-op Pain Management:    Induction: Intravenous  Airway Management Planned: Mask  Additional Equipment:   Intra-op Plan:   Post-operative Plan:   Informed Consent: I have reviewed the patients History and Physical, chart, labs and discussed the procedure including the risks, benefits and alternatives for the proposed anesthesia with the patient or authorized representative who has indicated his/her understanding and acceptance.     Plan Discussed with: CRNA  Anesthesia Plan Comments:         Anesthesia Quick Evaluation                                  Anesthesia Evaluation  Patient identified by MRN, date of birth, ID band Patient awake    Reviewed: Allergy & Precautions, NPO status , Patient's Chart, lab work & pertinent test results  Airway Mallampati: II  TM Distance: >3  FB Neck ROM: Limited    Dental  (+) Poor Dentition   Pulmonary neg pulmonary ROS,    Pulmonary exam normal        Cardiovascular hypertension, Pt. on medications Normal cardiovascular exam     Neuro/Psych PSYCHIATRIC DISORDERS Depression Bipolar Disorder negative neurological ROS     GI/Hepatic Neg liver ROS, GERD  Medicated and Controlled,  Endo/Other  diabetes, Type 2Hypothyroidism BG 151.  Renal/GU negative Renal ROS  negative genitourinary   Musculoskeletal negative musculoskeletal ROS (+)   Abdominal (+)  Abdomen: soft.    Peds negative pediatric ROS (+)  Hematology negative hematology ROS (+)   Anesthesia Other Findings   Reproductive/Obstetrics                             Anesthesia Physical  Anesthesia Plan  ASA: III  Anesthesia Plan: General   Post-op Pain Management:    Induction: Intravenous  Airway Management Planned: Mask  Additional Equipment:   Intra-op Plan:   Post-operative Plan:   Informed Consent: I have reviewed the patients History and Physical, chart, labs and discussed the procedure including the risks, benefits and alternatives for the proposed anesthesia with the patient or authorized representative who has indicated his/her understanding and acceptance.     Plan Discussed with: CRNA  Anesthesia Plan Comments:  Anesthesia Quick Evaluation

## 2015-05-19 NOTE — Procedures (Signed)
ECT SERVICES Physician's Interval Evaluation & Treatment Note  Patient Identification: Theodore Long MRN:  [id number] Date of Evaluation:  05/19/2015 TX #: 11  MADRS:   MMSE:   P.E. Findings:  No change to physical exam. Doing well no new complaints  Psychiatric Interval Note:  Mood stays good. No return of depression. No significant memory or confusion concerns  Subjective:  Patient is a 59 y.o. male seen for evaluation for Electroconvulsive Therapy. No specific new complaint  Treatment Summary:   [x]   Right Unilateral             []  Bilateral   % Energy : 0.3 ms 100%   Impedance: 720 ohms  Seizure Energy Index: 21,747 V squared  Postictal Suppression Index: 98%  Seizure Concordance Index: 99%  Medications  Pre Shock: Xylocaine 4 mg, Toradol 30 g, ketamine 100 mg, succinylcholine 150 mg  Post Shock: None  Seizure Duration: 23 seconds by EMG, 49 seconds by EEG   Comments: Follow-up in 3 weeks on December 14   Lungs:  [x]   Clear to auscultation               []  Other:   Heart:    [x]   Regular rhythm             []  irregular rhythm    [x]   Previous H&P reviewed, patient examined and there are NO CHANGES                 []   Previous H&P reviewed, patient examined and there are changes noted.   Mordecai RasmussenJohn Makinsley Schiavi, MD 11/23/201610:34 AM

## 2015-05-19 NOTE — Anesthesia Procedure Notes (Signed)
Date/Time: 05/19/2015 10:37 AM Performed by: Ginger CarneMICHELET, Ellissa Ayo Pre-anesthesia Checklist: Patient identified, Emergency Drugs available, Suction available and Patient being monitored Patient Re-evaluated:Patient Re-evaluated prior to inductionOxygen Delivery Method: Circle system utilized Preoxygenation: Pre-oxygenation with 100% oxygen Ventilation: Mask ventilation without difficulty

## 2015-06-09 ENCOUNTER — Encounter
Admission: RE | Admit: 2015-06-09 | Discharge: 2015-06-09 | Disposition: A | Discharge status: Home / Self Care | Payer: Medicare Other | Source: Ambulatory Visit | Attending: Psychiatry | Admitting: Psychiatry

## 2015-06-09 ENCOUNTER — Encounter: Payer: Self-pay | Admitting: Anesthesiology

## 2015-06-09 DIAGNOSIS — F329 Major depressive disorder, single episode, unspecified: Secondary | ICD-10-CM | POA: Insufficient documentation

## 2015-06-09 DIAGNOSIS — Z91030 Bee allergy status: Secondary | ICD-10-CM | POA: Diagnosis not present

## 2015-06-09 DIAGNOSIS — E039 Hypothyroidism, unspecified: Secondary | ICD-10-CM | POA: Diagnosis not present

## 2015-06-09 DIAGNOSIS — F332 Major depressive disorder, recurrent severe without psychotic features: Secondary | ICD-10-CM | POA: Diagnosis not present

## 2015-06-09 DIAGNOSIS — F259 Schizoaffective disorder, unspecified: Secondary | ICD-10-CM | POA: Insufficient documentation

## 2015-06-09 DIAGNOSIS — I10 Essential (primary) hypertension: Secondary | ICD-10-CM | POA: Diagnosis not present

## 2015-06-09 DIAGNOSIS — E119 Type 2 diabetes mellitus without complications: Secondary | ICD-10-CM | POA: Diagnosis not present

## 2015-06-09 DIAGNOSIS — K219 Gastro-esophageal reflux disease without esophagitis: Secondary | ICD-10-CM | POA: Insufficient documentation

## 2015-06-09 LAB — GLUCOSE, CAPILLARY: Glucose-Capillary: 157 mg/dL — ABNORMAL HIGH (ref 65–99)

## 2015-06-09 MED ORDER — LIDOCAINE HCL (CARDIAC) 20 MG/ML IV SOLN
4.0000 mg | Freq: Once | INTRAVENOUS | Status: AC
  Administered 2015-06-09: 4 mg via INTRAVENOUS

## 2015-06-09 MED ORDER — KETAMINE HCL 10 MG/ML IJ SOLN
100.0000 mg | Freq: Once | INTRAMUSCULAR | Status: AC
  Administered 2015-06-09: 100 mg via INTRAVENOUS

## 2015-06-09 MED ORDER — SODIUM CHLORIDE 0.9 % IV SOLN
INTRAVENOUS | Status: DC
  Administered 2015-06-09: 09:00:00 via INTRAVENOUS

## 2015-06-09 MED ORDER — KETOROLAC TROMETHAMINE 30 MG/ML IJ SOLN
30.0000 mg | Freq: Once | INTRAMUSCULAR | Status: AC
  Administered 2015-06-09: 30 mg via INTRAVENOUS

## 2015-06-09 MED ORDER — SUCCINYLCHOLINE CHLORIDE 20 MG/ML IJ SOLN
150.0000 mg | Freq: Once | INTRAMUSCULAR | Status: AC
Start: 2015-06-09 — End: 2015-06-09
  Administered 2015-06-09: 150 mg via INTRAVENOUS

## 2015-06-09 NOTE — Procedures (Signed)
ECT SERVICES Physician's Interval Evaluation & Treatment Note  Patient Identification: Theodore Long MRN:  [id number] Date of Evaluation:  06/09/2015 TX #: 12  MADRS:   MMSE:   P.E. Findings:  Physical exam unchanged. No new complaints. Doing well. No acute distress.  Psychiatric Interval Note:  Mood is slightly more down but he maintains activity no suicidal thoughts no psychotic symptoms.  Subjective:  Patient is a 59 y.o. male seen for evaluation for Electroconvulsive Therapy. Feels that the [place] season often brings on worse depression.  Treatment Summary:   [x]   Right Unilateral             []  Bilateral   % Energy : 0.873ms 100%   Impedance: 1390 ohms  Seizure Energy Index: 17,953 V squared  Postictal Suppression Index: 75%  Seizure Concordance Index: 99%  Medications  Pre Shock: Xylocaine 4 mg, Toradol 30 mg, ketamine 100 mg, succinylcholine 150 mg  Post Shock:  None  Seizure Duration: 28 seconds by EMG, 68 seconds by EEG   Comments: Follow-up 3 weeks   Lungs:  [x]   Clear to auscultation               []  Other:   Heart:    [x]   Regular rhythm             []  irregular rhythm    [x]   Previous H&P reviewed, patient examined and there are NO CHANGES                 []   Previous H&P reviewed, patient examined and there are changes noted.   Mordecai RasmussenJohn Sirena Riddle, MD 12/14/201610:43 AM

## 2015-06-09 NOTE — Anesthesia Postprocedure Evaluation (Signed)
Anesthesia Post Note  Patient: Theodore Long  Procedure(s) Performed: * No procedures listed *  Patient location during evaluation: PACU Anesthesia Type: General Level of consciousness: awake Pain management: pain level controlled Vital Signs Assessment: post-procedure vital signs reviewed and stable Respiratory status: spontaneous breathing Cardiovascular status: blood pressure returned to baseline Postop Assessment: no headache Anesthetic complications: no    Last Vitals:  Filed Vitals:   06/09/15 1128 06/09/15 1133  BP: 166/78 158/65  Pulse: 97 90  Temp:    Resp: 19 18    Last Pain: There were no vitals filed for this visit.               Darious Rehman M

## 2015-06-09 NOTE — H&P (Signed)
Theodore Long is an 59 y.o. male.   Chief Complaint: Patient feels a little bit worse. Somewhat low mood. Not nearly as bad as he was before he continues to maintain activity. Feels that the [place] season itself is often difficult for him and tends screen more depression. No suicidal ideation of psychotic symptoms no new physical complaints HPI: Receiving ECT treatment now in the maintenance phase for patient depression.  Past Medical History  Diagnosis Date  . Diabetes mellitus without complication (HCC)   . Hypothyroidism   . GERD (gastroesophageal reflux disease)   . Hypertension   . Bipolar affect, depressed (HCC)     Past Surgical History  Procedure Laterality Date  . Elbow arthroplasty Left   . Ankle fracture surgery    . Closed reduction hand fracture    . Dupuytren / palmar fasciotomy Left     Family History  Problem Relation Age of Onset  . Cancer Mother   . COPD Father    Social History:  reports that he has never smoked. He does not have any smokeless tobacco history on file. He reports that he does not drink alcohol or use illicit drugs.  Allergies:  Allergies  Allergen Reactions  . Bee Venom Shortness Of Breath and Swelling     (Not in a hospital admission)  Results for orders placed or performed during the hospital encounter of 06/09/15 (from the past 48 hour(s))  Glucose, capillary     Status: Abnormal   Collection Time: 06/09/15  9:09 AM  Result Value Ref Range   Glucose-Capillary 157 (H) 65 - 99 mg/dL   No results found.  Review of Systems  Constitutional: Negative.   HENT: Negative.   Eyes: Negative.   Respiratory: Negative.   Cardiovascular: Negative.   Gastrointestinal: Negative.   Musculoskeletal: Negative.   Skin: Negative.   Neurological: Negative.   Psychiatric/Behavioral: Negative for depression, suicidal ideas, hallucinations, memory loss and substance abuse. The patient is not nervous/anxious and does not have insomnia.     Blood  pressure 173/73, pulse 83, temperature 97 F (36.1 C), temperature source Tympanic, resp. rate 16, weight 89.359 kg (197 lb), SpO2 99 %. Physical Exam  Nursing note and vitals reviewed. Constitutional: He appears well-developed and well-nourished.  HENT:  Head: Normocephalic and atraumatic.  Eyes: Conjunctivae are normal. Pupils are equal, round, and reactive to light.  Neck: Normal range of motion.  Cardiovascular: Normal rate, regular rhythm and normal heart sounds.   Respiratory: Effort normal and breath sounds normal. No respiratory distress.  GI: Soft.  Musculoskeletal: Normal range of motion.  Neurological: He is alert.  Skin: Skin is warm and dry.  Psychiatric: He has a normal mood and affect. His behavior is normal. Judgment and thought content normal.     Assessment/Plan Reviewed current treatment plan and medication. Reviewed physical. Patient and I discussed pros and cons of following up in 2 or 3 weeks. We will schedule for 3 weeks. Continue current medicine. Also suggested the patient look into therapeutic light boxes and discussed that. Follow-up next treatment 3 weeks after today's.  Theodore Long 06/09/2015, 10:40 AM

## 2015-06-09 NOTE — Anesthesia Preprocedure Evaluation (Signed)
Anesthesia Evaluation  Patient identified by MRN, date of birth, ID band Patient awake    Reviewed: Allergy & Precautions, NPO status , Patient's Chart, lab work & pertinent test results  Airway Mallampati: II  TM Distance: >3 FB Neck ROM: Limited    Dental  (+) Poor Dentition   Pulmonary    Pulmonary exam normal        Cardiovascular Exercise Tolerance: Good hypertension, Pt. on medications Normal cardiovascular exam     Neuro/Psych Depression Bipolar Disorder    GI/Hepatic GERD  Medicated and Controlled,  Endo/Other  diabetes, Type 2BG 157.  Renal/GU      Musculoskeletal   Abdominal Normal abdominal exam  (+)   Peds  Hematology   Anesthesia Other Findings   Reproductive/Obstetrics                             Anesthesia Physical Anesthesia Plan  ASA: III  Anesthesia Plan: General   Post-op Pain Management:    Induction: Intravenous  Airway Management Planned: Mask  Additional Equipment:   Intra-op Plan:   Post-operative Plan:   Informed Consent: I have reviewed the patients History and Physical, chart, labs and discussed the procedure including the risks, benefits and alternatives for the proposed anesthesia with the patient or authorized representative who has indicated his/her understanding and acceptance.     Plan Discussed with: CRNA  Anesthesia Plan Comments:         Anesthesia Quick Evaluation

## 2015-06-09 NOTE — Transfer of Care (Signed)
Immediate Anesthesia Transfer of Care Note  Patient: Theodore Long  Procedure(s) Performed: ECT  Patient Location: PACU  Anesthesia Type:General  Level of Consciousness: sedated  Airway & Oxygen Therapy: Patient Spontanous Breathing and Patient connected to face mask oxygen  Post-op Assessment: Report given to RN and Post -op Vital signs reviewed and stable  Post vital signs: Reviewed and stable  Last Vitals:  Filed Vitals:   06/09/15 0842 06/09/15 1100  BP: 173/73 148/68  Pulse: 83 70  Temp: 36.1 C 37.7 C  Resp: 16 14    Complications: No apparent anesthesia complications

## 2015-06-09 NOTE — Anesthesia Procedure Notes (Signed)
Date/Time: 06/09/2015 10:50 AM Performed by: Lily KocherPERALTA, Donnette Macmullen Pre-anesthesia Checklist: Patient identified, Emergency Drugs available, Suction available and Patient being monitored Patient Re-evaluated:Patient Re-evaluated prior to inductionOxygen Delivery Method: Circle system utilized Preoxygenation: Pre-oxygenation with 100% oxygen Intubation Type: IV induction Ventilation: Mask ventilation without difficulty and Mask ventilation throughout procedure Airway Equipment and Method: Bite block Placement Confirmation: positive ETCO2 Dental Injury: Teeth and Oropharynx as per pre-operative assessment

## 2015-06-09 NOTE — H&P (Signed)
Theodore Long is an 59 y.o. male.   Chief Complaint: Patient is feeling good. Has schizoaffective disorder. Continues to have chronic hallucinations but they are under good control and not bothering him much. Mood is been stable no return of depression no return of any dangerous thinking HPI: Medications stable. Physically doing well. Patient is receiving maintenance ECT for control in part of schizoaffective disorder  Past Medical History  Diagnosis Date  . Diabetes mellitus without complication (HCC)   . Hypothyroidism   . GERD (gastroesophageal reflux disease)   . Hypertension   . Bipolar affect, depressed (HCC)     Past Surgical History  Procedure Laterality Date  . Elbow arthroplasty Left   . Ankle fracture surgery    . Closed reduction hand fracture    . Dupuytren / palmar fasciotomy Left     Family History  Problem Relation Age of Onset  . Cancer Mother   . COPD Father    Social History:  reports that he has never smoked. He does not have any smokeless tobacco history on file. He reports that he does not drink alcohol or use illicit drugs.  Allergies:  Allergies  Allergen Reactions  . Bee Venom Shortness Of Breath and Swelling     (Not in a hospital admission)  Results for orders placed or performed during the hospital encounter of 06/09/15 (from the past 48 hour(s))  Glucose, capillary     Status: Abnormal   Collection Time: 06/09/15  9:09 AM  Result Value Ref Range   Glucose-Capillary 157 (H) 65 - 99 mg/dL   No results found.  Review of Systems  Constitutional: Negative.   HENT: Negative.   Eyes: Negative.   Respiratory: Negative.   Cardiovascular: Negative.   Gastrointestinal: Negative.   Musculoskeletal: Negative.   Skin: Negative.   Neurological: Negative.   Psychiatric/Behavioral: Positive for hallucinations. Negative for depression, suicidal ideas, memory loss and substance abuse. The patient is not nervous/anxious and does not have insomnia.      Blood pressure 148/68, pulse 70, temperature 99.9 F (37.7 C), temperature source Tympanic, resp. rate 14, weight 89.359 kg (197 lb), SpO2 100 %. Physical Exam  Nursing note and vitals reviewed. Constitutional: He appears well-developed and well-nourished.  HENT:  Head: Normocephalic and atraumatic.  Eyes: Conjunctivae are normal. Pupils are equal, round, and reactive to light.  Neck: Normal range of motion.  Cardiovascular: Normal rate, regular rhythm and normal heart sounds.   Respiratory: Effort normal and breath sounds normal. No respiratory distress.  GI: Soft.  Musculoskeletal: Normal range of motion.  Neurological: He is alert.  Skin: Skin is warm and dry.  Psychiatric: He has a normal mood and affect. His behavior is normal. Judgment and thought content normal.     Assessment/Plan Follow-up in 5 weeks per her normal schedule after today. Patient agreeable to the plan. No other change to medicine.  John Clapacs 06/09/2015, 11:03 AM

## 2015-06-09 NOTE — Procedures (Signed)
ECT SERVICES Physician's Interval Evaluation & Treatment Note  Patient Identification: Theodore Long MRN:  [id number] Date of Evaluation:  06/09/2015 TX #: 34  MADRS:   MMSE:   P.E. Findings:  No new physical complaints. Vital signs stable. No change to physical exam.  Psychiatric Interval Note:  Mood is doing well no return of any dangerous thinking not sad. Eating well. Hallucinations under reasonably good control  Subjective:  Patient is a 59 y.o. male seen for evaluation for Electroconvulsive Therapy. No specific new complaints  Treatment Summary:   [x]   Right Unilateral             []  Bilateral   % Energy : 0.3 ms, 100%   Impedance: 1390 ohms  Seizure Energy Index: 18,431 V squared  Postictal Suppression Index: 96%  Seizure Concordance Index: 97%  Medications  Pre Shock: Toradol 30 mg, ketamine 100 mg, rocuronium 5 mg, succinylcholine 140 mg  Post Shock: None  Seizure Duration: 36 seconds by EMG, 38 seconds by EEG   Comments: Follow-up 5 weeks January 18   Lungs:  [x]   Clear to auscultation               []  Other:   Heart:    [x]   Regular rhythm             []  irregular rhythm    [x]   Previous H&P reviewed, patient examined and there are NO CHANGES                 []   Previous H&P reviewed, patient examined and there are changes noted.   Mordecai RasmussenJohn Deniss Wormley, MD 12/14/201611:05 AM

## 2015-06-30 ENCOUNTER — Inpatient Hospital Stay: Admission: RE | Admit: 2015-06-30 | Payer: Medicare Other | Source: Ambulatory Visit

## 2015-07-12 ENCOUNTER — Encounter: Payer: Self-pay | Admitting: Anesthesiology

## 2015-07-12 ENCOUNTER — Encounter: Payer: Medicare Other | Admitting: Anesthesiology

## 2015-07-12 ENCOUNTER — Encounter
Admission: RE | Admit: 2015-07-12 | Discharge: 2015-07-12 | Disposition: A | Discharge status: Home / Self Care | Payer: Medicare Other | Source: Ambulatory Visit | Attending: Psychiatry | Admitting: Psychiatry

## 2015-07-12 DIAGNOSIS — Z91030 Bee allergy status: Secondary | ICD-10-CM | POA: Diagnosis not present

## 2015-07-12 DIAGNOSIS — F332 Major depressive disorder, recurrent severe without psychotic features: Secondary | ICD-10-CM | POA: Insufficient documentation

## 2015-07-12 DIAGNOSIS — I10 Essential (primary) hypertension: Secondary | ICD-10-CM | POA: Diagnosis not present

## 2015-07-12 DIAGNOSIS — K219 Gastro-esophageal reflux disease without esophagitis: Secondary | ICD-10-CM | POA: Diagnosis not present

## 2015-07-12 DIAGNOSIS — E039 Hypothyroidism, unspecified: Secondary | ICD-10-CM | POA: Diagnosis not present

## 2015-07-12 DIAGNOSIS — E119 Type 2 diabetes mellitus without complications: Secondary | ICD-10-CM | POA: Insufficient documentation

## 2015-07-12 DIAGNOSIS — F3189 Other bipolar disorder: Secondary | ICD-10-CM | POA: Diagnosis not present

## 2015-07-12 LAB — GLUCOSE, CAPILLARY
GLUCOSE-CAPILLARY: 198 mg/dL — AB (ref 65–99)
Glucose-Capillary: 207 mg/dL — ABNORMAL HIGH (ref 65–99)

## 2015-07-12 MED ORDER — LIDOCAINE HCL (CARDIAC) 20 MG/ML IV SOLN
4.0000 mg | Freq: Once | INTRAVENOUS | Status: AC
  Administered 2015-07-12: 4 mg via INTRAVENOUS

## 2015-07-12 MED ORDER — LIDOCAINE HCL (CARDIAC) 20 MG/ML IV SOLN: 150.0000 mg | Freq: Once | INTRAVENOUS | Status: DC

## 2015-07-12 MED ORDER — KETAMINE HCL 10 MG/ML IJ SOLN
100.0000 mg | Freq: Once | INTRAMUSCULAR | Status: AC
  Administered 2015-07-12: 100 mg via INTRAVENOUS

## 2015-07-12 MED ORDER — DEXTROSE 5 % IV SOLN: 250.0000 mL | Freq: Once | INTRAVENOUS | Status: DC

## 2015-07-12 MED ORDER — SUCCINYLCHOLINE CHLORIDE 20 MG/ML IJ SOLN
150.0000 mg | Freq: Once | INTRAMUSCULAR | Status: AC
  Administered 2015-07-12: 150 mg via INTRAVENOUS

## 2015-07-12 MED ORDER — SODIUM CHLORIDE 0.9 % IV SOLN
INTRAVENOUS | Status: DC | PRN
  Administered 2015-07-12: 11:00:00 via INTRAVENOUS

## 2015-07-12 MED ORDER — KETOROLAC TROMETHAMINE 30 MG/ML IJ SOLN
30.0000 mg | Freq: Once | INTRAMUSCULAR | Status: AC
  Administered 2015-07-12: 30 mg via INTRAVENOUS

## 2015-07-12 MED ORDER — SODIUM CHLORIDE 0.9 % IV SOLN
250.0000 mL | Freq: Once | INTRAVENOUS | Status: AC
  Administered 2015-07-12: 500 mL via INTRAVENOUS

## 2015-07-12 MED ORDER — ROCURONIUM BROMIDE 50 MG/5ML IV SOLN: 5.0000 mg | Freq: Once | INTRAVENOUS | Status: DC

## 2015-07-12 MED ORDER — SUCCINYLCHOLINE CHLORIDE 20 MG/ML IJ SOLN: 140.0000 mg | Freq: Once | INTRAMUSCULAR | Status: DC

## 2015-07-12 NOTE — Anesthesia Preprocedure Evaluation (Signed)
Anesthesia Evaluation  Patient identified by MRN, date of birth, ID band Patient awake    Reviewed: Allergy & Precautions, NPO status , Patient's Chart, lab work & pertinent test results  Airway Mallampati: I  TM Distance: >3 FB Neck ROM: Full    Dental  (+) Teeth Intact   Pulmonary    Pulmonary exam normal        Cardiovascular Exercise Tolerance: Good hypertension, Pt. on medications Normal cardiovascular exam     Neuro/Psych Depression Bipolar Disorder    GI/Hepatic GERD  Medicated and Controlled,  Endo/Other  diabetes, Type 2BG 207.  Renal/GU      Musculoskeletal   Abdominal Normal abdominal exam  (+)   Peds  Hematology   Anesthesia Other Findings   Reproductive/Obstetrics                             Anesthesia Physical Anesthesia Plan  ASA: III  Anesthesia Plan: General   Post-op Pain Management:    Induction: Intravenous  Airway Management Planned: Mask  Additional Equipment:   Intra-op Plan:   Post-operative Plan:   Informed Consent: I have reviewed the patients History and Physical, chart, labs and discussed the procedure including the risks, benefits and alternatives for the proposed anesthesia with the patient or authorized representative who has indicated his/her understanding and acceptance.     Plan Discussed with: CRNA  Anesthesia Plan Comments:         Anesthesia Quick Evaluation

## 2015-07-12 NOTE — Transfer of Care (Signed)
Immediate Anesthesia Transfer of Care Note  Patient: Theodore Long  Procedure(s) Performed: * No procedures listed *  Patient Location: PACU  Anesthesia Type:General  Level of Consciousness: sedated and responds to stimulation  Airway & Oxygen Therapy: Patient Spontanous Breathing and Patient connected to face mask oxygen  Post-op Assessment: Report given to RN and Post -op Vital signs reviewed and stable  Post vital signs: Reviewed and stable  Last Vitals:  Filed Vitals:   07/12/15 0813 07/12/15 1055  BP: 153/76 173/83  Pulse: 88 79  Temp: 35.6 C 37.4 C  Resp: 18 18    Complications: No apparent anesthesia complications

## 2015-07-12 NOTE — Anesthesia Postprocedure Evaluation (Signed)
Anesthesia Post Note  Patient: Theodore Long  Procedure(s) Performed: * No procedures listed *  Patient location during evaluation: PACU Anesthesia Type: General Level of consciousness: awake Pain management: pain level controlled Vital Signs Assessment: post-procedure vital signs reviewed and stable Respiratory status: spontaneous breathing Cardiovascular status: blood pressure returned to baseline Postop Assessment: no headache Anesthetic complications: no    Last Vitals:  Filed Vitals:   07/12/15 1120 07/12/15 1134  BP:  134/73  Pulse: 102 90  Temp: 37.3 C   Resp: 15 15    Last Pain: There were no vitals filed for this visit.               Avneet Ashmore M

## 2015-07-12 NOTE — H&P (Signed)
Ida RogueRicky L Schrom is an 60 y.o. male.   Chief Complaint: Patient feels that his mood has been stable. Sleeping adequately. No new complaint. HPI: Patient receiving maintenance ECT treatment for treatment of major depression. Symptoms under adequate control. Did recently start taking Adderall from his primary psychiatrist was some improvement in energy  Past Medical History  Diagnosis Date  . Diabetes mellitus without complication (HCC)   . Hypothyroidism   . GERD (gastroesophageal reflux disease)   . Hypertension   . Bipolar affect, depressed (HCC)     Past Surgical History  Procedure Laterality Date  . Elbow arthroplasty Left   . Ankle fracture surgery    . Closed reduction hand fracture    . Dupuytren / palmar fasciotomy Left     Family History  Problem Relation Age of Onset  . Cancer Mother   . COPD Father    Social History:  reports that he has never smoked. He does not have any smokeless tobacco history on file. He reports that he does not drink alcohol or use illicit drugs.  Allergies:  Allergies  Allergen Reactions  . Bee Venom Shortness Of Breath and Swelling     (Not in a hospital admission)  Results for orders placed or performed during the hospital encounter of 07/12/15 (from the past 48 hour(s))  Glucose, capillary     Status: Abnormal   Collection Time: 07/12/15  8:18 AM  Result Value Ref Range   Glucose-Capillary 207 (H) 65 - 99 mg/dL   No results found.  Review of Systems  Constitutional: Negative.   HENT: Negative.   Eyes: Negative.   Respiratory: Negative.   Cardiovascular: Negative.   Gastrointestinal: Negative.   Musculoskeletal: Negative.   Skin: Negative.   Neurological: Negative.   Psychiatric/Behavioral: Negative for depression, suicidal ideas, hallucinations, memory loss and substance abuse. The patient is not nervous/anxious and does not have insomnia.     Blood pressure 153/76, pulse 88, temperature 96.1 F (35.6 C), temperature  source Oral, resp. rate 18, height 6\' 2"  (1.88 m), weight 90.266 kg (199 lb), SpO2 100 %. Physical Exam  Nursing note and vitals reviewed. Constitutional: He appears well-developed and well-nourished.  HENT:  Head: Normocephalic and atraumatic.  Eyes: Conjunctivae are normal. Pupils are equal, round, and reactive to light.  Neck: Normal range of motion.  Cardiovascular: Normal rate, regular rhythm and normal heart sounds.   Respiratory: Effort normal and breath sounds normal. No respiratory distress.  GI: Soft.  Musculoskeletal: Normal range of motion.  Neurological: He is alert.  Skin: Skin is warm and dry.  Psychiatric: He has a normal mood and affect. His behavior is normal. Judgment and thought content normal.     Assessment/Plan Patient is stable medically doing well. ECT is helping. Treatment today with next treatment scheduled for one month on February 13  Tedric Leeth 07/12/2015, 10:39 AM

## 2015-07-12 NOTE — Procedures (Signed)
ECT SERVICES Physician's Interval Evaluation & Treatment Note  Patient Identification: Theodore Long MRN:  [id number] Date of Evaluation:  07/12/2015 TX #: 13  MADRS:   MMSE:   P.E. Findings:  No new physical complaints. Heart and lungs clear. Physically doing well.  Psychiatric Interval Note:  Mood is staying stable. Recently started low-dose Adderall.  Subjective:  Patient is a 60 y.o. male seen for evaluation for Electroconvulsive Therapy. No specific new complaints  Treatment Summary:   [x]   Right Unilateral             []  Bilateral   % Energy : 0.3 ms 100%   Impedance: 1200 ohms  Seizure Energy Index: 23,208 V squared  Postictal Suppression Index: 95%  Seizure Concordance Index: 99%  Medications  Pre Shock: Xylocaine 4 mg, Toradol 30 mg, ketamine 100 mg, succinylcholine 150 mg  Post Shock: None  Seizure Duration: 29 seconds by EMG, 46 seconds by EEG   Comments: Follow-up 4 weeks February 13   Lungs:  [x]   Clear to auscultation               []  Other:   Heart:    [x]   Regular rhythm             []  irregular rhythm    [x]   Previous H&P reviewed, patient examined and there are NO CHANGES                 []   Previous H&P reviewed, patient examined and there are changes noted.   Mordecai RasmussenJohn Makaila Windle, MD 1/16/201710:41 AM

## 2015-08-23 ENCOUNTER — Encounter
Admission: RE | Admit: 2015-08-23 | Discharge: 2015-08-23 | Disposition: A | Discharge status: Home / Self Care | Payer: Medicare Other | Source: Ambulatory Visit | Attending: Psychiatry | Admitting: Psychiatry

## 2015-08-23 ENCOUNTER — Encounter: Payer: Self-pay | Admitting: Anesthesiology

## 2015-08-23 DIAGNOSIS — Z9889 Other specified postprocedural states: Secondary | ICD-10-CM | POA: Insufficient documentation

## 2015-08-23 DIAGNOSIS — K219 Gastro-esophageal reflux disease without esophagitis: Secondary | ICD-10-CM | POA: Insufficient documentation

## 2015-08-23 DIAGNOSIS — E039 Hypothyroidism, unspecified: Secondary | ICD-10-CM | POA: Diagnosis not present

## 2015-08-23 DIAGNOSIS — Z91030 Bee allergy status: Secondary | ICD-10-CM | POA: Diagnosis not present

## 2015-08-23 DIAGNOSIS — E119 Type 2 diabetes mellitus without complications: Secondary | ICD-10-CM | POA: Insufficient documentation

## 2015-08-23 DIAGNOSIS — I10 Essential (primary) hypertension: Secondary | ICD-10-CM | POA: Diagnosis not present

## 2015-08-23 DIAGNOSIS — F332 Major depressive disorder, recurrent severe without psychotic features: Secondary | ICD-10-CM | POA: Diagnosis not present

## 2015-08-23 LAB — GLUCOSE, CAPILLARY: Glucose-Capillary: 185 mg/dL — ABNORMAL HIGH (ref 65–99)

## 2015-08-23 MED ORDER — SODIUM CHLORIDE 0.9 % IV SOLN
250.0000 mL | Freq: Once | INTRAVENOUS | Status: AC
  Administered 2015-08-23: 500 mL via INTRAVENOUS

## 2015-08-23 MED ORDER — KETAMINE HCL 10 MG/ML IJ SOLN
INTRAMUSCULAR | Status: DC | PRN
  Administered 2015-08-23: 100 mg via INTRAVENOUS

## 2015-08-23 MED ORDER — SUCCINYLCHOLINE CHLORIDE 20 MG/ML IJ SOLN
INTRAMUSCULAR | Status: DC | PRN
  Administered 2015-08-23: 150 mg via INTRAVENOUS

## 2015-08-23 MED ORDER — KETOROLAC TROMETHAMINE 30 MG/ML IJ SOLN
30.0000 mg | Freq: Once | INTRAMUSCULAR | Status: AC
  Administered 2015-08-23: 30 mg via INTRAVENOUS

## 2015-08-23 NOTE — Anesthesia Procedure Notes (Signed)
Date/Time: 08/23/2015 11:10 AM Performed by: Henrietta Hoover Pre-anesthesia Checklist: Patient identified, Emergency Drugs available, Suction available, Patient being monitored and Timeout performed Patient Re-evaluated:Patient Re-evaluated prior to inductionOxygen Delivery Method: Circle system utilized and Simple face mask Preoxygenation: Pre-oxygenation with 100% oxygen Intubation Type: IV induction Airway Equipment and Method: Bite block Dental Injury: Teeth and Oropharynx as per pre-operative assessment

## 2015-08-23 NOTE — Anesthesia Preprocedure Evaluation (Signed)
Anesthesia Evaluation  Patient identified by MRN, date of birth, ID band Patient awake    Reviewed: Allergy & Precautions, NPO status , Patient's Chart, lab work & pertinent test results  History of Anesthesia Complications Negative for: history of anesthetic complications  Airway Mallampati: I  TM Distance: >3 FB Neck ROM: Full    Dental  (+) Teeth Intact   Pulmonary neg shortness of breath, neg sleep apnea, neg COPD, Recent URI , Residual Cough,    Pulmonary exam normal        Cardiovascular Exercise Tolerance: Good hypertension, Pt. on medications (-) anginaNormal cardiovascular exam     Neuro/Psych    GI/Hepatic GERD  Medicated and Controlled,  Endo/Other  diabetes, Type 2Hypothyroidism BG 207.  Renal/GU      Musculoskeletal   Abdominal Normal abdominal exam  (+)   Peds  Hematology   Anesthesia Other Findings   Reproductive/Obstetrics                             Anesthesia Physical  Anesthesia Plan  ASA: III  Anesthesia Plan: General   Post-op Pain Management:    Induction: Intravenous  Airway Management Planned: Mask  Additional Equipment:   Intra-op Plan:   Post-operative Plan:   Informed Consent: I have reviewed the patients History and Physical, chart, labs and discussed the procedure including the risks, benefits and alternatives for the proposed anesthesia with the patient or authorized representative who has indicated his/her understanding and acceptance.   Dental Advisory Given  Plan Discussed with: CRNA, Surgeon and Anesthesiologist  Anesthesia Plan Comments:         Anesthesia Quick Evaluation

## 2015-08-23 NOTE — H&P (Signed)
Theodore Long is an 60 y.o. male.   Chief Complaint: Patient denies any new symptoms were complaints. Says his mood is feeling good. HPI: Patient with a history of recurrent severe depression. His [place] score is a little bit up but the patient is not symptomatically feeling worse.  Past Medical History  Diagnosis Date  . Diabetes mellitus without complication (HCC)   . Hypothyroidism   . GERD (gastroesophageal reflux disease)   . Hypertension   . Bipolar affect, depressed (HCC)     Past Surgical History  Procedure Laterality Date  . Elbow arthroplasty Left   . Ankle fracture surgery    . Closed reduction hand fracture    . Dupuytren / palmar fasciotomy Left     Family History  Problem Relation Age of Onset  . Cancer Mother   . COPD Father    Social History:  reports that he has never smoked. He does not have any smokeless tobacco history on file. He reports that he does not drink alcohol or use illicit drugs.  Allergies:  Allergies  Allergen Reactions  . Bee Venom Shortness Of Breath and Swelling     (Not in a hospital admission)  Results for orders placed or performed during the hospital encounter of 08/23/15 (from the past 48 hour(s))  Glucose, capillary     Status: Abnormal   Collection Time: 08/23/15 10:05 AM  Result Value Ref Range   Glucose-Capillary 185 (H) 65 - 99 mg/dL   No results found.  Review of Systems  Constitutional: Negative.   HENT: Negative.   Eyes: Negative.   Respiratory: Negative.   Cardiovascular: Negative.   Gastrointestinal: Negative.   Musculoskeletal: Negative.   Skin: Negative.   Neurological: Negative.   Psychiatric/Behavioral: Negative for depression, suicidal ideas, hallucinations, memory loss and substance abuse. The patient is not nervous/anxious and does not have insomnia.     Blood pressure 146/57, pulse 85, temperature 98.9 F (37.2 C), temperature source Oral, resp. rate 18, height  (1.88 m), weight 91.173 kg (201  lb), SpO2 100 %. Physical Exam  Nursing note and vitals reviewed. Constitutional: He appears well-developed and well-nourished.  HENT:  Head: Normocephalic and atraumatic.  Eyes: Conjunctivae are normal. Pupils are equal, round, and reactive to light.  Neck: Normal range of motion.  Cardiovascular: Normal rate, regular rhythm and normal heart sounds.   Respiratory: Effort normal and breath sounds normal.  GI: Soft.  Musculoskeletal: Normal range of motion.  Neurological: He is alert.  Skin: Skin is warm and dry.  Psychiatric: He has a normal mood and affect. His speech is normal. Judgment and thought content normal. He is slowed. Cognition and memory are normal.     Assessment/Plan We will have treatment today and it is requests follow-up in 6 weeks although we are open to having her return sooner if needed.  [name], MD 08/23/2015, 11:08 AM

## 2015-08-23 NOTE — Transfer of Care (Signed)
Immediate Anesthesia Transfer of Care Note  Patient: Theodore Long  Procedure(s) Performed: * No procedures listed *  Patient Location: PACU  Anesthesia Type:General  Level of Consciousness: alert  and sedated  Airway & Oxygen Therapy: Patient Spontanous Breathing and Patient connected to face mask oxygen  Post-op Assessment: Report given to RN and Post -op Vital signs reviewed and stable  Post vital signs: Reviewed and stable  Last Vitals:  Filed Vitals:   08/23/15 0929 08/23/15 1130  BP: 146/57 164/70  Pulse: 85 89  Temp: 37.2 C 37.6 C  Resp: 18 12    Complications: No apparent anesthesia complications

## 2015-08-23 NOTE — Procedures (Signed)
[facility] SERVICES Physician's Interval Evaluation & Treatment Note  Patient Identification: MEARL OLVER MRN:  [id number] Date of Evaluation:  08/23/2015 TX #: [id number]  MADRS: 20  MMSE: 30 P.E. Findings:  Blood pressure slightly elevated otherwise physical exam all normal lungs are normal  Psychiatric Interval Note:  Mood is stated as feeling fine although the mattress score is slightly elevated  Subjective:  Patient is a 60 y.o. male seen for evaluation for Electroconvulsive Therapy. No specific new complaints  Treatment Summary:     Right Unilateral              Bilateral   % Energy : 0.3 ms 100%   Impedance: 1310 ohms  Seizure Energy Index: 17,660 V squared  Postictal Suppression Index: 91%  Seizure Concordance Index: 99%  Medications  Pre Shock: Toradol 30 mg, ketamine 100 mg, succinylcholine 150 mg  Post Shock:    Seizure Duration: 28 seconds by observed motor, 51 seconds by EEG   Comments: Follow-up 6 weeks at his request which puts [place] to April 10. Call sooner if needed   Lungs:    Clear to auscultation                Other:   Heart:      Regular rhythm              irregular rhythm      Previous H&P reviewed, patient examined and there are NO CHANGES                   Previous H&P reviewed, patient examined and there are changes noted.   [name], MD 2/27/201711:10 AM

## 2015-08-26 NOTE — Addendum Note (Signed)
Addendum  created 08/26/15 1254 by Lenard Simmer, MD   Modules edited: Clinical Notes   Clinical Notes:  File: [id number]

## 2015-08-26 NOTE — Anesthesia Postprocedure Evaluation (Signed)
Anesthesia Post Note  Patient: Theodore Long  Procedure(s) Performed: * No procedures listed *  Patient location during evaluation: PACU Anesthesia Type: General Level of consciousness: awake and alert Pain management: pain level controlled Vital Signs Assessment: post-procedure vital signs reviewed and stable Respiratory status: spontaneous breathing, nonlabored ventilation, respiratory function stable and patient connected to nasal cannula oxygen Cardiovascular status: blood pressure returned to baseline and stable Postop Assessment: no signs of nausea or vomiting Anesthetic complications: no    Last Vitals:  Filed Vitals:   08/23/15 1200 08/23/15 1209  BP:  143/65  Pulse:  97  Temp: 37.6 C   Resp:  16    Last Pain: There were no vitals filed for this visit.               Lenard Simmer

## 2015-10-27 ENCOUNTER — Telehealth: Payer: Self-pay | Admitting: *Deleted

## 2019-12-10 ENCOUNTER — Other Ambulatory Visit: Payer: Self-pay | Admitting: Podiatry

## 2019-12-16 NOTE — Patient Instructions (Signed)
Theodore CHANDRAN  12/16/2019     '@[email or web address]'$ @   Your procedure is scheduled on  12/24/2019 .  Report to [facility] at  1200  P.M.  Call this number if you have problems the morning of surgery:  [phone]   Remember:  Do not eat or drink after midnight.                         Take these medicines the morning of surgery with A SIP OF WATER  Adderall, levothyroxine, singulair, procardia, symbyax, protonix. DO NOT Take any medications for diabetes the morning of your procedure.    Do not wear jewelry, make-up or nail polish.  Do not wear lotions, powders, or perfumes. Please wear deodorant and brush your teeth,  Do not shave 48 hours prior to surgery.  Men may shave face and neck.  Do not bring valuables to the hospital.  [facility] is not responsible for any belongings or valuables.  Contacts, dentures or bridgework may not be worn into surgery.  Leave your suitcase in the car.  After surgery it may be brought to your room.  For patients admitted to the hospital, discharge time will be determined by your treatment team.  Patients discharged the day of surgery will not be allowed to drive home.   Name and phone number of your driver:   Family Special instructions:  DO NOT smoke the morning of your procedure.  Please read over the following fact sheets that you were given. Anesthesia Post-op Instructions and Care and Recovery After Surgery       Incision Care, Adult An incision is a cut that a doctor makes in your skin for surgery (for a procedure). Most times, these cuts are closed after surgery. Your cut from surgery may be closed with stitches (sutures), staples, skin glue, or skin tape (adhesive strips). You may need to return to your doctor to have stitches or staples taken out. This may happen many days or many weeks after your surgery. The cut needs to be well cared for so it does not get infected. How to care for your cut Cut  care   Follow instructions from your doctor about how to take care of your cut. Make sure you: ? Wash your hands with soap and water before you change your bandage (dressing). If you cannot use soap and water, use hand sanitizer. ? Change your bandage as told by your doctor. ? Leave stitches, skin glue, or skin tape in place. They may need to stay in place for 2 weeks or longer. If tape strips get loose and curl up, you may trim the loose edges. Do not remove tape strips completely unless your doctor says it is okay.  Check your cut area every day for signs of infection. Check for: ? More redness, swelling, or pain. ? More fluid or blood. ? Warmth. ? Pus or a bad smell.  Ask your doctor how to clean the cut. This may include: ? Using mild soap and water. ? Using a clean towel to pat the cut dry after you clean it. ? Putting a cream or ointment on the cut. Do this only as told by your doctor. ? Covering the cut with a clean bandage.  Ask your doctor when you can leave the cut uncovered.  Do not take baths, swim, or use a hot tub until your doctor  says it is okay. Ask your doctor if you can take showers. You may only be allowed to take sponge baths for bathing. Medicines  If you were prescribed an antibiotic medicine, cream, or ointment, take the antibiotic or put it on the cut as told by your doctor. Do not stop taking or putting on the antibiotic even if your condition gets better.  Take over-the-counter and prescription medicines only as told by your doctor. General instructions  Limit movement around your cut. This helps healing. ? Avoid straining, lifting, or exercise for the first month, or for as long as told by your doctor. ? Follow instructions from your doctor about going back to your normal activities. ? Ask your doctor what activities are safe.  Protect your cut from the sun when you are outside for the first 6 months, or for as long as told by your doctor. Put on  sunscreen around the scar or cover up the scar.  Keep all follow-up visits as told by your doctor. This is important. Contact a doctor if:  Your have more redness, swelling, or pain around the cut.  You have more fluid or blood coming from the cut.  Your cut feels warm to the touch.  You have pus or a bad smell coming from the cut.  You have a fever or shaking chills.  You feel sick to your stomach (nauseous) or you throw up (vomit).  You are dizzy.  Your stitches or staples come undone. Get help right away if:  You have a red streak coming from your cut.  Your cut bleeds through the bandage and the bleeding does not stop with gentle pressure.  The edges of your cut open up and separate.  You have very bad (severe) pain.  You have a rash.  You are confused.  You pass out (faint).  You have trouble breathing and you have a fast heartbeat. This information is not intended to replace advice given to you by your health care provider. Make sure you discuss any questions you have with your health care provider. Document Revised: 10/30/2016 Document Reviewed: 02/18/2016 Elsevier Patient Education  [place].  Phantom Limb Pain Phantom limb pain is pain in a body part that no longer exists. It usually happens in an arm or leg (extremity) after it has been surgically removed (amputated). Most cases of phantom limb pain are brief. However, it can last for years, and it may be severe and disabling. The exact mechanism of how phantom limb pain occurs is not known. The problem may start in a part of the brain that processes feelings and awareness (sensations) from the rest of the body (sensory cortex). When a body part is lost, the sensory cortex may not be able to handle the loss and may reorganize (rewire) itself to make up for the lost signals. What are the causes? This condition only happens in patients with amputations, but the cause is not known. It may be caused  by:  Damaged nerve endings (peripheral nerves).  Scar tissue.  Rewiring of nerves in the brain or spine (central nerves). What are the signs or symptoms? Symptoms vary and are related to the lost limb or the remaining stump. Stump pain may be mistaken for phantom limb pain, or you may have both at the same time. Symptoms include:  Phantom pain. Pain often feels like the pain you had before the amputation. It usually comes and goes, and it gets better over time. The pain may  feel like: ? Burning. ? Stabbing. ? Throbbing. ? Cramping. ? Prickling. ? Crushing. ? It is moving over time from the farthest part of the amputated limb (fingers or toes) up to the site of amputation, as if the limb is shrinking (telescoping).  Phantom sensation. This is a feeling other than pain, as if the limb is still part of the body. This may include sensations of: ? Movement. ? Shape or length. ? Position. Physical or emotional factors can trigger or worsen pain sensations. Those factors may include:  Weather changes.  Stress.  Strong emotions.  Certain positions or movements of the body.  Pressure on the affected area. How is this diagnosed? Diagnosis is based on your history of amputation and symptoms that you have after surgery. Your health care provider may:  Do a physical exam.  Talk with you about your symptoms and past history of pain. You may have imaging tests to examine your stump, such as X-rays or CT scan. How is this treated? There are different therapies and medicines that may give you relief. Work with your health care provider until you have adequate relief. Treatment options may include:  Pain medicine. Medicine can be given for pain right after surgery (acute pain) and for pain that goes on for some time (chronic pain). Commonly used medicines include: ? Antidepressant medicine. ? Anticonvulsant medicine. ? Narcotics, analgesics, or anti-inflammatory medicine. ? Nerve  blocks.  Techniques that help to retrain the brain and nervous system (movement representation techniques), such as: ? Looking at your unaffected limb in a mirror and thinking about painless movement of your extremity (mirror therapy). ? Thinking about moving your limbs without actual movement (motor therapy). ? Watching and sensing the movement of other people (action observation).  Relaxation techniques that use the mind and body to control pain. These often involve guided imagery, deep breathing, and muscle relaxation exercises.  Hypnosis and mental imagery. These techniques can help you focus or concentrate to regain control.  Biofeedback. This involves using monitors that alert you to changes in your breathing, heart rate, skin temperature, or muscle activity, and using relaxation techniques to reverse those changes. Biofeedback tells you if the techniques you are using are effective.  Acupuncture. This involves inserting small needles into certain places on your skin to help relieve pain.  Sensory discrimination training. For this treatment, painless stimulation is applied to different parts of your stump and you describe what you feel. This may help with nerve rewiring.  Physical therapy involving the stump, which may include: ? Exercise. This may be physical movement, or it may involve applying sound waves (ultrasound) or tapping (percussion therapy) to the stump. These exercises may help to heal and retrain tissue and nerves. ? Massage. Stump massage creates new sensations, breaks up scar tissue, and prepares the stump (desensitization) for an artificial limb (prosthesis). ? Heat or cold treatment. This can improve blood flow and reduce inflammation. ? Applying painless electrical pulses to the skin to prevent sensations of pain from reaching the brain (transcutaneous electrical nerve stimulation, TENS). Follow these instructions at home:   Take over-the-counter and prescription  medicines only as told by your health care provider.  Do not drive or use heavy machinery while taking prescription pain medicine.  Do not use any products that contain nicotine or tobacco, such as cigarettes and e-cigarettes. If you need help quitting, ask your health care provider.  Join a support group. Express your feelings and talk with someone you trust.  Seek  counseling or talk therapy with a mental health professional. This may be helpful if you are having trouble managing your emotions about losing an extremity.  Keep all follow-up visits as told by your health care providers and therapists. This is important. Contact a health care provider if:  You have a sore on your stump that does not get better with treatment.  Your pain does not improve with medicine or treatment. Get help right away if:  You have suicidal thoughts. If you ever feel like you may hurt yourself or others, or have thoughts about taking your own life, get help right away. You can go to your nearest emergency department or call:  Your local emergency services (911 in the [place]  A suicide crisis helpline, such as the [place] at 812-597-8955. This is open 24 hours a day. Summary  Phantom limb pain is pain in a body part that no longer exists. It happens in an arm or leg (extremity) after it has been surgically removed (amputated).  Medicines or techniques that help to retrain the brain and nervous system (movement representation techniques) may help to relieve symptoms.  Physical therapy for phantom limb pain may involve exercise, massage, heat or cold therapy, or painless stimulation of the skin (transcutaneous electrical nerve stimulation, TENS). This information is not intended to replace advice given to you by your health care provider. Make sure you discuss any questions you have with your health care provider. Document Revised: 05/25/2017 Document Reviewed:  07/21/2016 Elsevier Patient Education  2020 [place] With an Amputation An amputation is a surgical procedure to remove all or part of an arm or leg (limb). After this surgery, it will take time for you to heal and get used to living with the amputation. There are things you can do to help yourself adjust. Living with an amputation can be challenging, but it is often possible to do all of the activities that you used to do. How to manage lifestyle changes      Return to your normal activities as told by your health care provider. Ask your health care provider what activities are safe for you.  You may choose to have an artificial limb (prosthesis) made for you. Use and care for your prosthesis as told by your health care provider.  Discuss all of your leisure interests with your health care provider and prosthesis specialist. Equipment changes can often be made, which may allow you to return to a sport or hobby. Some Teacher, English as a foreign language for this purpose.  Talk with your health care team about returning to work. ? When you are ready to return to work, your therapists can evaluate your job site and make recommendations to help you do your job. ? If you are not able to return to the same job, [employer] can help you with job retraining.  Talk with your health care provider about returning to driving. You may: ? Work with an occupational therapist to learn new ways for safely driving with an amputation. ? Work with a prosthesis specialist or physical therapist to identify assistive devices for safe driving. How to recognize stress Some common challenges of living with an amputation may bring about stress. These issues are normal. They include:  Changes in how you move around.  Changes in how you care for yourself.  Changes in how you participate in leisure activities.  Emotions after the amputation, such as grief and  anger.  Issues with body image and weight.  Problems with pain and treatment. How to manage stress Use these strategies to ease stress related to the daily challenges of living with an amputation:  Be aware of any sources of your stress. Monitor yourself for symptoms of stress. Identify what challenges cause the most stress for you.  Rethink the situation. Try to: ? Think realistically about stressful challenges. Do not ignore these challenges. Do not overreact to them. ? Try to find the positives in a stressful situation. Do not focus on the negatives.  Talk about your emotional challenges, such as grief, with a mental health professional.  Connect with other people who have gone through the same experience.  Find ways to help relax your body and mind. Examples include: ? Meditation, deep breathing, or progressive relaxation techniques. ? Hypnosis or guided imagery. ? Yoga or tai chi. ? Biofeedback or mindfulness techniques. ? Keeping a journal. ? Doing hobbies, like listening to music or being out in nature. ? Exercise. Talk with your rehabilitation team about the best way to get 30 minutes of physical activity at least 5 days a week. Where to find support: Talking to others  Look for a local or online support group for people living with an amputation. Finances  Talk to your insurance company about what assistive devices your plan covers.  Contact national or local amputee charities to find out if you are eligible for scholarships and medical equipment for people in need.  People in [employer] service may be eligible for financial assistance or programs that support amputees. Rehabilitation A rehabilitation program can help you regain mobility and independence. Your rehabilitation team may include:  Physicians and nurses.  Physical and occupational therapists.  Prosthesis specialists.  Social workers.  Mental health professionals.  Diet and nutrition  specialists. Follow these instructions at home: Managing pain  Work with your health care provider to manage any residual or phantom limb pain. This may include: ? Pain medicine. ? Physical therapy. ? Techniques that help retrain the brain and nervous system (movement representation techniques). ? Control and instrumentation engineer. For this treatment, stimulation is applied to different parts of your stump, and you describe what you feel. ? Biofeedback. This involves using monitors that alert you to changes in your breathing, heart rate, skin temperature, or muscle activity, and using relaxation techniques to reverse those changes. ? Acupuncture. Eating and drinking  Work with a dietitian to develop an eating plan that helps you maintain a healthy body weight. Your plan may include a balance of: ? Fresh fruits and vegetables. ? Whole grains. ? Lean meats. ? Low-fat dairy. ? Healthy fats, such as fish, nuts, avocado, or olive oil.  Do not drink alcohol to cope with stress. General instructions  Take over-the-counter and prescription medicines only as told by your health care provider.  Do not use any products that contain nicotine or tobacco, such as cigarettes and e-cigarettes. If you need help quitting, ask your health care provider.  Keep all follow-up visits as told by your health care provider. This is important. Where to find more information You can find more information about living with an amputation from:  Amputee Coalition: www.amputee-coalition.org  Amputee Support Groups: amputee.supportgroups.[place]: www.nationalamputation.[facility] on Health, Physical Activity and Disability: www.nchpad.org  [employer] [place]: www.disabledsports[place].org Contact a health care provider if:  Your pain does not improve with medicine or treatment.  You have trouble managing your emotions about losing an  extremity. Get help right away if  you:  Have severe pain. Summary  It will take time for you to heal and get used to living with an amputation.  Look for a local or online support group for people living with an amputation.  Work with your health care provider to manage any phantom limb pain. This information is not intended to replace advice given to you by your health care provider. Make sure you discuss any questions you have with your health care provider. Document Revised: 10/04/2018 Document Reviewed: 05/09/2017 Elsevier Patient Education  2020 [place] Anesthesia, Adult, Care After This sheet gives you information about how to care for yourself after your procedure. Your health care provider may also give you more specific instructions. If you have problems or questions, contact your health care provider. What can I expect after the procedure? After the procedure, the following side effects are common:  Pain or discomfort at the IV site.  Nausea.  Vomiting.  Sore throat.  Trouble concentrating.  Feeling cold or chills.  Weak or tired.  Sleepiness and fatigue.  Soreness and body aches. These side effects can affect parts of the body that were not involved in surgery. Follow these instructions at home:  For at least 24 hours after the procedure:  Have a responsible adult stay with you. It is important to have someone help care for you until you are awake and alert.  Rest as needed.  Do not: ? Participate in activities in which you could fall or become injured. ? Drive. ? Use heavy machinery. ? Drink alcohol. ? Take sleeping pills or medicines that cause drowsiness. ? Make important decisions or sign legal documents. ? Take care of children on your own. Eating and drinking  Follow any instructions from your health care provider about eating or drinking restrictions.  When you feel hungry, start by eating small amounts of foods that are soft and easy to digest (bland), such as  toast. Gradually return to your regular diet.  Drink enough fluid to keep your urine pale yellow.  If you vomit, rehydrate by drinking water, juice, or clear broth. General instructions  If you have sleep apnea, surgery and certain medicines can increase your risk for breathing problems. Follow instructions from your health care provider about wearing your sleep device: ? Anytime you are sleeping, including during daytime naps. ? While taking prescription pain medicines, sleeping medicines, or medicines that make you drowsy.  Return to your normal activities as told by your health care provider. Ask your health care provider what activities are safe for you.  Take over-the-counter and prescription medicines only as told by your health care provider.  If you smoke, do not smoke without supervision.  Keep all follow-up visits as told by your health care provider. This is important. Contact a health care provider if:  You have nausea or vomiting that does not get better with medicine.  You cannot eat or drink without vomiting.  You have pain that does not get better with medicine.  You are unable to pass urine.  You develop a skin rash.  You have a fever.  You have redness around your IV site that gets worse. Get help right away if:  You have difficulty breathing.  You have chest pain.  You have blood in your urine or stool, or you vomit blood. Summary  After the procedure, it is common to have a sore throat or nausea. It is also common to  feel tired.  Have a responsible adult stay with you for the first 24 hours after general anesthesia. It is important to have someone help care for you until you are awake and alert.  When you feel hungry, start by eating small amounts of foods that are soft and easy to digest (bland), such as toast. Gradually return to your regular diet.  Drink enough fluid to keep your urine pale yellow.  Return to your normal activities as told by  your health care provider. Ask your health care provider what activities are safe for you. This information is not intended to replace advice given to you by your health care provider. Make sure you discuss any questions you have with your health care provider. Document Revised: 06/15/2017 Document Reviewed: 01/26/2017 Elsevier Patient Education  [place]. How to Use Chlorhexidine for Bathing Chlorhexidine gluconate (CHG) is a germ-killing (antiseptic) solution that is used to clean the skin. It can get rid of the bacteria that normally live on the skin and can keep them away for about 24 hours. To clean your skin with CHG, you may be given:  A CHG solution to use in the shower or as part of a sponge bath.  A prepackaged cloth that contains CHG. Cleaning your skin with CHG may help lower the risk for infection:  While you are staying in the intensive care unit of the hospital.  If you have a vascular access, such as a central line, to provide short-term or long-term access to your veins.  If you have a catheter to drain urine from your bladder.  If you are on a ventilator. A ventilator is a machine that helps you breathe by moving air in and out of your lungs.  After surgery. What are the risks? Risks of using CHG include:  A skin reaction.  Hearing loss, if CHG gets in your ears.  Eye injury, if CHG gets in your eyes and is not rinsed out.  The CHG product catching fire. Make sure that you avoid smoking and flames after applying CHG to your skin. Do not use CHG:  If you have a chlorhexidine allergy or have previously reacted to chlorhexidine.  On babies younger than 28 months of age. How to use CHG solution  Use CHG only as told by your health care provider, and follow the instructions on the label.  Use the full amount of CHG as directed. Usually, this is one bottle. During a shower Follow these steps when using CHG solution during a shower (unless your health  care provider gives you different instructions): 1. Start the shower. 2. Use your normal soap and shampoo to wash your face and hair. 3. Turn off the shower or move out of the shower stream. 4. Pour the CHG onto a clean washcloth. Do not use any type of brush or rough-edged sponge. 5. Starting at your neck, lather your body down to your toes. Make sure you follow these instructions: ? If you will be having surgery, pay special attention to the part of your body where you will be having surgery. Scrub this area for at least 1 minute. ? Do not use CHG on your head or face. If the solution gets into your ears or eyes, rinse them well with water. ? Avoid your genital area. ? Avoid any areas of skin that have broken skin, cuts, or scrapes. ? Scrub your back and under your arms. Make sure to wash skin folds. 6. Let the lather sit  on your skin for 1-2 minutes or as long as told by your health care provider. 7. Thoroughly rinse your entire body in the shower. Make sure that all body creases and crevices are rinsed well. 8. Dry off with a clean towel. Do not put any substances on your body afterward--such as powder, lotion, or perfume--unless you are told to do so by your health care provider. Only use lotions that are recommended by the manufacturer. 9. Put on clean clothes or pajamas. 10. If it is the night before your surgery, sleep in clean sheets.  During a sponge bath Follow these steps when using CHG solution during a sponge bath (unless your health care provider gives you different instructions): 1. Use your normal soap and shampoo to wash your face and hair. 2. Pour the CHG onto a clean washcloth. 3. Starting at your neck, lather your body down to your toes. Make sure you follow these instructions: ? If you will be having surgery, pay special attention to the part of your body where you will be having surgery. Scrub this area for at least 1 minute. ? Do not use CHG on your head or face. If the  solution gets into your ears or eyes, rinse them well with water. ? Avoid your genital area. ? Avoid any areas of skin that have broken skin, cuts, or scrapes. ? Scrub your back and under your arms. Make sure to wash skin folds. 4. Let the lather sit on your skin for 1-2 minutes or as long as told by your health care provider. 5. Using a different clean, wet washcloth, thoroughly rinse your entire body. Make sure that all body creases and crevices are rinsed well. 6. Dry off with a clean towel. Do not put any substances on your body afterward--such as powder, lotion, or perfume--unless you are told to do so by your health care provider. Only use lotions that are recommended by the manufacturer. 7. Put on clean clothes or pajamas. 8. If it is the night before your surgery, sleep in clean sheets. How to use CHG prepackaged cloths  Only use CHG cloths as told by your health care provider, and follow the instructions on the label.  Use the CHG cloth on clean, dry skin.  Do not use the CHG cloth on your head or face unless your health care provider tells you to.  When washing with the CHG cloth: ? Avoid your genital area. ? Avoid any areas of skin that have broken skin, cuts, or scrapes. Before surgery Follow these steps when using a CHG cloth to clean before surgery (unless your health care provider gives you different instructions): 1. Using the CHG cloth, vigorously scrub the part of your body where you will be having surgery. Scrub using a back-and-forth motion for 3 minutes. The area on your body should be completely wet with CHG when you are done scrubbing. 2. Do not rinse. Discard the cloth and let the area air-dry. Do not put any substances on the area afterward, such as powder, lotion, or perfume. 3. Put on clean clothes or pajamas. 4. If it is the night before your surgery, sleep in clean sheets.  For general bathing Follow these steps when using CHG cloths for general bathing  (unless your health care provider gives you different instructions). 1. Use a separate CHG cloth for each area of your body. Make sure you wash between any folds of skin and between your fingers and toes. Wash your body in the  following order, switching to a new cloth after each step: ? The front of your neck, shoulders, and chest. ? Both of your arms, under your arms, and your hands. ? Your stomach and groin area, avoiding the genitals. ? Your right leg and foot. ? Your left leg and foot. ? The back of your neck, your back, and your buttocks. 2. Do not rinse. Discard the cloth and let the area air-dry. Do not put any substances on your body afterward--such as powder, lotion, or perfume--unless you are told to do so by your health care provider. Only use lotions that are recommended by the manufacturer. 3. Put on clean clothes or pajamas. Contact a health care provider if:  Your skin gets irritated after scrubbing.  You have questions about using your solution or cloth. Get help right away if:  Your eyes become very red or swollen.  Your eyes itch badly.  Your skin itches badly and is red or swollen.  Your hearing changes.  You have trouble seeing.  You have swelling or tingling in your mouth or throat.  You have trouble breathing.  You swallow any chlorhexidine. Summary  Chlorhexidine gluconate (CHG) is a germ-killing (antiseptic) solution that is used to clean the skin. Cleaning your skin with CHG may help to lower your risk for infection.  You may be given CHG to use for bathing. It may be in a bottle or in a prepackaged cloth to use on your skin. Carefully follow your health care provider's instructions and the instructions on the product label.  Do not use CHG if you have a chlorhexidine allergy.  Contact your health care provider if your skin gets irritated after scrubbing. This information is not intended to replace advice given to you by your health care provider.  Make sure you discuss any questions you have with your health care provider. Document Revised: 08/29/2018 Document Reviewed: 05/10/2017 Elsevier Patient Education  [place].

## 2019-12-22 ENCOUNTER — Encounter (HOSPITAL_COMMUNITY)
Admission: RE | Admit: 2019-12-22 | Discharge: 2019-12-22 | Disposition: A | Discharge status: Home / Self Care | Payer: Medicare Other | Source: Ambulatory Visit | Attending: Podiatry | Admitting: Podiatry

## 2019-12-22 ENCOUNTER — Encounter (HOSPITAL_COMMUNITY): Payer: Self-pay

## 2019-12-22 ENCOUNTER — Other Ambulatory Visit: Payer: Self-pay

## 2019-12-22 ENCOUNTER — Other Ambulatory Visit (HOSPITAL_COMMUNITY)
Admission: RE | Admit: 2019-12-22 | Discharge: 2019-12-22 | Disposition: A | Discharge status: Home / Self Care | Payer: Medicare Other | Source: Ambulatory Visit | Attending: Podiatry | Admitting: Podiatry

## 2019-12-22 DIAGNOSIS — Z20822 Contact with and (suspected) exposure to covid-19: Secondary | ICD-10-CM | POA: Diagnosis not present

## 2019-12-22 DIAGNOSIS — Z01818 Encounter for other preprocedural examination: Secondary | ICD-10-CM | POA: Insufficient documentation

## 2019-12-22 LAB — BASIC METABOLIC PANEL
Anion gap: 11 (ref 5–15)
BUN: 17 mg/dL (ref 8–23)
CO2: 28 mmol/L (ref 22–32)
Calcium: 9.3 mg/dL (ref 8.9–10.3)
Chloride: 99 mmol/L (ref 98–111)
Creatinine, Ser: 0.91 mg/dL (ref 0.61–1.24)
GFR calc Af Amer: >60 mL/min (ref >60)
GFR calc non Af Amer: >60 mL/min (ref >60)
Glucose, Bld: 264 mg/dL — ABNORMAL HIGH (ref 70–99)
Potassium: 4.5 mmol/L (ref 3.5–5.1)
Sodium: 138 mmol/L (ref 135–145)

## 2019-12-22 LAB — HEMOGLOBIN A1C
Hgb A1c MFr Bld: 7.2 % — ABNORMAL HIGH (ref 4.8–5.6)
Mean Plasma Glucose: 159.94 mg/dL

## 2019-12-22 LAB — SARS CORONAVIRUS 2 (TAT 6-24 HRS): SARS Coronavirus 2: NEGATIVE

## 2019-12-24 ENCOUNTER — Encounter (HOSPITAL_COMMUNITY): Payer: Self-pay | Admitting: *Deleted

## 2019-12-24 ENCOUNTER — Ambulatory Visit (HOSPITAL_COMMUNITY): Payer: Medicare Other | Admitting: Anesthesiology

## 2019-12-24 ENCOUNTER — Encounter (HOSPITAL_COMMUNITY)
Admission: RE | Disposition: A | Discharge status: Home / Self Care | Payer: Self-pay | Source: Home / Self Care | Attending: Podiatry

## 2019-12-24 ENCOUNTER — Ambulatory Visit (HOSPITAL_COMMUNITY)
Admission: RE | Admit: 2019-12-24 | Discharge: 2019-12-24 | Disposition: A | Discharge status: Home / Self Care | Payer: Medicare Other | Attending: Podiatry | Admitting: Podiatry

## 2019-12-24 ENCOUNTER — Ambulatory Visit (HOSPITAL_COMMUNITY): Payer: Medicare Other

## 2019-12-24 DIAGNOSIS — E785 Hyperlipidemia, unspecified: Secondary | ICD-10-CM | POA: Insufficient documentation

## 2019-12-24 DIAGNOSIS — Z7984 Long term (current) use of oral hypoglycemic drugs: Secondary | ICD-10-CM | POA: Insufficient documentation

## 2019-12-24 DIAGNOSIS — E119 Type 2 diabetes mellitus without complications: Secondary | ICD-10-CM | POA: Diagnosis not present

## 2019-12-24 DIAGNOSIS — F419 Anxiety disorder, unspecified: Secondary | ICD-10-CM | POA: Diagnosis not present

## 2019-12-24 DIAGNOSIS — E1169 Type 2 diabetes mellitus with other specified complication: Secondary | ICD-10-CM | POA: Diagnosis not present

## 2019-12-24 DIAGNOSIS — M1990 Unspecified osteoarthritis, unspecified site: Secondary | ICD-10-CM | POA: Diagnosis not present

## 2019-12-24 DIAGNOSIS — Z9889 Other specified postprocedural states: Secondary | ICD-10-CM

## 2019-12-24 DIAGNOSIS — F319 Bipolar disorder, unspecified: Secondary | ICD-10-CM | POA: Insufficient documentation

## 2019-12-24 DIAGNOSIS — Z8249 Family history of ischemic heart disease and other diseases of the circulatory system: Secondary | ICD-10-CM | POA: Diagnosis not present

## 2019-12-24 DIAGNOSIS — Z79899 Other long term (current) drug therapy: Secondary | ICD-10-CM | POA: Insufficient documentation

## 2019-12-24 DIAGNOSIS — E039 Hypothyroidism, unspecified: Secondary | ICD-10-CM | POA: Insufficient documentation

## 2019-12-24 DIAGNOSIS — K219 Gastro-esophageal reflux disease without esophagitis: Secondary | ICD-10-CM | POA: Diagnosis not present

## 2019-12-24 DIAGNOSIS — M869 Osteomyelitis, unspecified: Secondary | ICD-10-CM | POA: Diagnosis present

## 2019-12-24 DIAGNOSIS — I10 Essential (primary) hypertension: Secondary | ICD-10-CM | POA: Insufficient documentation

## 2019-12-24 HISTORY — PX: AMPUTATION: SHX166

## 2019-12-24 LAB — GLUCOSE, CAPILLARY
Glucose-Capillary: 121 mg/dL — ABNORMAL HIGH (ref 70–99)
Glucose-Capillary: 89 mg/dL (ref 70–99)

## 2019-12-24 SURGERY — AMPUTATION DIGIT
Anesthesia: General | Laterality: Right

## 2019-12-24 MED ORDER — EPHEDRINE SULFATE-NACL 50-0.9 MG/10ML-% IV SOSY
PREFILLED_SYRINGE | INTRAVENOUS | Status: DC | PRN
  Administered 2019-12-24 (×2): 10 mg via INTRAVENOUS
  Administered 2019-12-24: 5 mg via INTRAVENOUS

## 2019-12-24 MED ORDER — MIDAZOLAM HCL 2 MG/2ML IJ SOLN
INTRAMUSCULAR | Status: AC
  Filled 2019-12-24: qty 2

## 2019-12-24 MED ORDER — CLINDAMYCIN PHOSPHATE 600 MG/50ML IV SOLN
600.0000 mg | Freq: Once | INTRAVENOUS | Status: AC
  Administered 2019-12-24: 600 mg via INTRAVENOUS

## 2019-12-24 MED ORDER — BUPIVACAINE HCL (PF) 0.5 % IJ SOLN
INTRAMUSCULAR | Status: AC
  Filled 2019-12-24: qty 30

## 2019-12-24 MED ORDER — PROPOFOL 10 MG/ML IV BOLUS
INTRAVENOUS | Status: DC | PRN
  Administered 2019-12-24: 100 mg via INTRAVENOUS
  Administered 2019-12-24: 200 mg via INTRAVENOUS

## 2019-12-24 MED ORDER — CHLORHEXIDINE GLUCONATE 0.12 % MT SOLN: 15.0000 mL | Freq: Once | OROMUCOSAL | Status: DC

## 2019-12-24 MED ORDER — LACTATED RINGERS IV SOLN: INTRAVENOUS | Status: DC

## 2019-12-24 MED ORDER — PHENYLEPHRINE 40 MCG/ML (10ML) SYRINGE FOR IV PUSH (FOR BLOOD PRESSURE SUPPORT)
PREFILLED_SYRINGE | INTRAVENOUS | Status: DC | PRN
  Administered 2019-12-24: 40 ug via INTRAVENOUS

## 2019-12-24 MED ORDER — LIDOCAINE HCL (PF) 1 % IJ SOLN
INTRAMUSCULAR | Status: AC
  Filled 2019-12-24: qty 30

## 2019-12-24 MED ORDER — FENTANYL CITRATE (PF) 100 MCG/2ML IJ SOLN
INTRAMUSCULAR | Status: AC
  Filled 2019-12-24: qty 2

## 2019-12-24 MED ORDER — FENTANYL CITRATE (PF) 100 MCG/2ML IJ SOLN: 25.0000 ug | INTRAMUSCULAR | Status: DC | PRN

## 2019-12-24 MED ORDER — LIDOCAINE HCL 1 % IJ SOLN
INTRAMUSCULAR | Status: DC | PRN
  Administered 2019-12-24: 10 mL via INTRAMUSCULAR

## 2019-12-24 MED ORDER — 0.9 % SODIUM CHLORIDE (POUR BTL) OPTIME
TOPICAL | Status: DC | PRN
  Administered 2019-12-24: 1000 mL

## 2019-12-24 MED ORDER — ORAL CARE MOUTH RINSE: 15.0000 mL | Freq: Once | OROMUCOSAL | Status: DC

## 2019-12-24 MED ORDER — ONDANSETRON HCL 4 MG/2ML IJ SOLN: 4.0000 mg | Freq: Once | INTRAMUSCULAR | Status: DC | PRN

## 2019-12-24 MED ORDER — LACTATED RINGERS IV SOLN: INTRAVENOUS | Status: DC | PRN

## 2019-12-24 MED ORDER — MIDAZOLAM HCL 5 MG/5ML IJ SOLN
INTRAMUSCULAR | Status: DC | PRN
  Administered 2019-12-24: 2 mg via INTRAVENOUS

## 2019-12-24 MED ORDER — CLINDAMYCIN PHOSPHATE 600 MG/50ML IV SOLN
INTRAVENOUS | Status: AC
  Filled 2019-12-24: qty 50

## 2019-12-24 SURGICAL SUPPLY — 39 items
BANDAGE ESMARK 4X12 BL STRL LF (DISPOSABLE) IMPLANT
BLADE SURG 15 STRL LF DISP TIS (BLADE) ×1 IMPLANT
BLADE SURG 15 STRL SS (BLADE) ×3
BNDG CONFORM 2 STRL LF (GAUZE/BANDAGES/DRESSINGS) ×3 IMPLANT
BNDG ELASTIC 4X5.8 VLCR NS LF (GAUZE/BANDAGES/DRESSINGS) ×3 IMPLANT
BNDG ESMARK 4X12 BLUE STRL LF (DISPOSABLE) ×3
CLOTH BEACON ORANGE TIMEOUT ST (SAFETY) ×3 IMPLANT
COVER LIGHT HANDLE STERIS (MISCELLANEOUS) ×3 IMPLANT
COVER WAND RF STERILE (DRAPES) ×3 IMPLANT
CUFF TOURN SGL QUICK 18X4 (TOURNIQUET CUFF) ×3 IMPLANT
DECANTER SPIKE VIAL GLASS SM (MISCELLANEOUS) ×6 IMPLANT
DRSG ADAPTIC 3X8 NADH LF (GAUZE/BANDAGES/DRESSINGS) ×3 IMPLANT
ELECT REM PT RETURN 9FT ADLT (ELECTROSURGICAL) ×3
ELECTRODE REM PT RTRN 9FT ADLT (ELECTROSURGICAL) ×1 IMPLANT
GAUZE KERLIX 2X3 DERM STRL LF (GAUZE/BANDAGES/DRESSINGS) ×2 IMPLANT
GAUZE SPONGE 4X4 12PLY STRL (GAUZE/BANDAGES/DRESSINGS) ×3 IMPLANT
GLOVE BIOGEL M 6.5 STRL (GLOVE) ×2 IMPLANT
GLOVE BIOGEL PI IND STRL 6.5 (GLOVE) IMPLANT
GLOVE BIOGEL PI IND STRL 7.5 (GLOVE) ×1 IMPLANT
GLOVE BIOGEL PI INDICATOR 6.5 (GLOVE) ×2
GLOVE BIOGEL PI INDICATOR 7.5 (GLOVE) ×2
GLOVE ECLIPSE 7.0 STRL STRAW (GLOVE) ×6 IMPLANT
GOWN STRL REUS W/ TWL XL LVL3 (GOWN DISPOSABLE) ×1 IMPLANT
GOWN STRL REUS W/TWL LRG LVL3 (GOWN DISPOSABLE) ×3 IMPLANT
GOWN STRL REUS W/TWL XL LVL3 (GOWN DISPOSABLE) ×3
KIT TURNOVER KIT A (KITS) ×3 IMPLANT
MANIFOLD NEPTUNE II (INSTRUMENTS) ×3 IMPLANT
NDL HYPO 25X1 1.5 SAFETY (NEEDLE) ×2 IMPLANT
NDL HYPO 27GX1-1/4 (NEEDLE) ×2 IMPLANT
NEEDLE HYPO 25X1 1.5 SAFETY (NEEDLE) ×6 IMPLANT
NEEDLE HYPO 27GX1-1/4 (NEEDLE) ×6 IMPLANT
NS IRRIG 1000ML POUR BTL (IV SOLUTION) ×3 IMPLANT
PACK BASIC LIMB (CUSTOM PROCEDURE TRAY) ×3 IMPLANT
PAD ARMBOARD 7.5X6 YLW CONV (MISCELLANEOUS) ×3 IMPLANT
SET BASIN LINEN APH (SET/KITS/TRAYS/PACK) ×3 IMPLANT
SUT ETHILON 3 0 FSL (SUTURE) ×2 IMPLANT
SUT MON AB 3-0 SH 27 (SUTURE) ×2 IMPLANT
SWAB CULTURE LIQ STUART DBL (MISCELLANEOUS) ×2 IMPLANT
SYR CONTROL 10ML LL (SYRINGE) ×6 IMPLANT

## 2019-12-24 NOTE — Anesthesia Procedure Notes (Signed)
Procedure Name: LMA Insertion Date/Time: 12/24/2019 1:25 PM Performed by: Elgie Congo, CRNA Pre-anesthesia Checklist: Patient identified, Emergency Drugs available, Suction available and Patient being monitored Patient Re-evaluated:Patient Re-evaluated prior to induction Oxygen Delivery Method: Circle system utilized Preoxygenation: Pre-oxygenation with 100% oxygen Induction Type: IV induction Ventilation: Mask ventilation without difficulty LMA: LMA inserted LMA Size: 5.0 Number of attempts: 1 Placement Confirmation: positive ETCO2 Tube secured with: Tape Dental Injury: Teeth and Oropharynx as per pre-operative assessment

## 2019-12-24 NOTE — Brief Op Note (Signed)
12/24/2019  2:01 PM  PATIENT:  Theodore Long  64 y.o. male  PRE-OPERATIVE DIAGNOSIS:  OSTEOMYELITIS OF RIGHT THIRD TOE  POST-OPERATIVE DIAGNOSIS:   OSTEOMYELITIS OF RIGHT THIRD TOE  PROCEDURE:  Partial toe amputation of the right third toe.   SURGEON:  Surgeon(s) and Role:    * Paxton Binns, Layla Barter, DPM - Primary  PHYSICIAN ASSISTANT:   ASSISTANTS: None.  ANESTHESIA:   local and MAC  EBL:  None.   BLOOD ADMINISTERED:none  DRAINS: none   LOCAL MEDICATIONS USED:  MARCAINE   , LIDOCAINE  and Amount: 10 ml  SPECIMEN:  Source of Specimen:  Right foot third toe. and right clean margin.   DISPOSITION OF SPECIMEN:  PATHOLOGY  COUNTS:  YES  TOURNIQUET:   Total Tourniquet Time Documented: Calf (Right) - 15 minutes Total: Calf (Right) - 15 minutes   DICTATION: .Viviann Spare Dictation  PLAN OF CARE: Discharge to home after PACU  PATIENT DISPOSITION:  PACU - hemodynamically stable.   Delay start of Pharmacological VTE agent (>24hrs) due to surgical blood loss or risk of bleeding: not applicable

## 2019-12-24 NOTE — Op Note (Signed)
12/24/2019  2:01 PM  PATIENT:  Theodore Long  64 y.o. male  PRE-OPERATIVE DIAGNOSIS:  OSTEOMYELITIS OF RIGHT THIRD TOE  POST-OPERATIVE DIAGNOSIS:   OSTEOMYELITIS OF RIGHT THIRD TOE  PROCEDURE:  Partial toe amputation of the right third toe.   SURGEON:  Surgeon(s) and Role:    * Yoshiko Keleher, Layla Barter, DPM - Primary  ASSISTANTS: None.  ANESTHESIA:   local and MAC  EBL:  None.   BLOOD ADMINISTERED:none  LOCAL MEDICATIONS USED:  MARCAINE   , LIDOCAINE  and Amount: 10 ml  SPECIMEN:  Source of Specimen:  Right foot third toe. and right toe clean margin.   DISPOSITION OF SPECIMEN:  PATHOLOGY  COUNTS:  YES  TOURNIQUET:   Total Tourniquet Time Documented: Calf (Right) - 15 minutes Total: Calf (Right) - 15 minutes  Patient was brought into the operating room laid supine on the operating table. Ankle tourniquet was applied to the surgical extremity. Following IV sedation, a local block was achieved using 10 cc of mixture of 1% plain lidocaine with 0.5% marcaine. The foot was the prepped, scrubbed and draped in aseptic manner. Using an esmarch band the tourniquet on the surgical site was inflatted at 243mHG.   Attention was directed towards the right third toe. Right third toe is swollen and red. There is ulceration noted at the tip of the right third toe. MRI and radiograph confirmed the osteomyelitis of the distal phalanx. Fish mouth incision was planned to removed the part of the toe. Using skin blade the toe was disarticulated at the DIPJ and removed and sent for pathology. Wound cultures were obtained at this time. The middle phalanx was also removed and send off as clean margin. Wound was irrigated with normal saline. The skin was closed using 3-0 nylon. Dry sterile dressing was applied. Tourniquet was deflated. Patient was transferred to PACU with Vital signs stable.

## 2019-12-24 NOTE — Anesthesia Postprocedure Evaluation (Signed)
Anesthesia Post Note  Patient: Theodore Long  Procedure(s) Performed: AMPUTATION TOE RIGHT THIRD (Right )  Patient location during evaluation: PACU Anesthesia Type: General Level of consciousness: awake and alert Pain management: pain level controlled Vital Signs Assessment: post-procedure vital signs reviewed and stable Respiratory status: spontaneous breathing Cardiovascular status: stable Postop Assessment: no apparent nausea or vomiting Anesthetic complications: no   No complications documented.   Last Vitals:  Vitals:   12/24/19 1129 12/24/19 1411  BP: (!) 150/63 (!) 133/57  Pulse:  75  Resp: 18 20  Temp: 37.1 C (P) 36.5 C  SpO2: 98% 93%    Last Pain:  Vitals:   12/24/19 1129  TempSrc: Oral  PainSc: 4                  Krystle Oberman Hristova

## 2019-12-24 NOTE — Anesthesia Preprocedure Evaluation (Addendum)
Anesthesia Evaluation  Patient identified by MRN, date of birth, ID band Patient awake    Reviewed: Allergy & Precautions, NPO status , Patient's Chart, lab work & pertinent test results  Airway Mallampati: I  TM Distance: >3 FB Neck ROM: Full    Dental  (+) Teeth Intact   Pulmonary neg shortness of breath, neg sleep apnea, neg COPD, Recent URI , Residual Cough,    Pulmonary exam normal        Cardiovascular Exercise Tolerance: Good hypertension, Pt. on medications (-) anginaNormal cardiovascular exam     Neuro/Psych PSYCHIATRIC DISORDERS Depression Bipolar Disorder    GI/Hepatic GERD  Medicated and Controlled,  Endo/Other  diabetes, Type 2Hypothyroidism BG 207.  Renal/GU      Musculoskeletal   Abdominal Normal abdominal exam  (+)   Peds  Hematology   Anesthesia Other Findings   Reproductive/Obstetrics                             Anesthesia Physical  Anesthesia Plan  ASA: III  Anesthesia Plan: General   Post-op Pain Management:    Induction: Intravenous  PONV Risk Score and Plan: 2  Airway Management Planned: Mask and Nasal Cannula  Additional Equipment:   Intra-op Plan:   Post-operative Plan:   Informed Consent: I have reviewed the patients History and Physical, chart, labs and discussed the procedure including the risks, benefits and alternatives for the proposed anesthesia with the patient or authorized representative who has indicated his/her understanding and acceptance.     Dental Advisory Given  Plan Discussed with: CRNA  Anesthesia Plan Comments:        Anesthesia Quick Evaluation

## 2019-12-24 NOTE — H&P (Signed)
.   HISTORY AND PHYSICAL INTERVAL NOTE:  12/24/2019  12:49 PM  Theodore Long  has presented today for surgery, with the diagnosis of OSTEOMYELITIS OF RIGHT THIRD TOE.  The various methods of treatment have been discussed with the patient.  No guarantees were given.  After consideration of risks, benefits and other options for treatment, the patient has consented to surgery.  I have reviewed the patients' chart and labs.    Patient Vitals for the past 24 hrs:  BP Temp Temp src Resp SpO2  12/24/19 1129 (!) 150/63 98.7 F (37.1 C) Oral 18 98 %    A history and physical examination was performed in my office.  The patient was reexamined.  There have been no changes to this history and physical examination.  Erskine Emery, DPM

## 2019-12-24 NOTE — Transfer of Care (Signed)
Immediate Anesthesia Transfer of Care Note  Patient: Ryland Tungate Rasberry  Procedure(s) Performed: AMPUTATION TOE RIGHT THIRD (Right )  Patient Location: PACU  Anesthesia Type:General  Level of Consciousness: awake  Airway & Oxygen Therapy: Patient Spontanous Breathing  Post-op Assessment: Report given to RN and Post -op Vital signs reviewed and stable  Post vital signs: Reviewed and stable  Last Vitals:  Vitals Value Taken Time  BP 133/57 12/24/19 1411  Temp    Pulse 75 12/24/19 1412  Resp 19 12/24/19 1412  SpO2 93 % 12/24/19 1412  Vitals shown include unvalidated device data.  Last Pain:  Vitals:   12/24/19 1129  TempSrc: Oral  PainSc: 4       Patients Stated Pain Goal: 8 (12/24/19 1129)  Complications: No complications documented.

## 2019-12-24 NOTE — Discharge Instructions (Signed)
These instructions will give you an idea of what to expect after surgery and how to manage issues that may arise before your first post op office visit.  Pain Management Pain is best managed by "staying ahead" of it. If pain gets out of control, it is difficult to get it back under control. Local anesthesia that lasts 6-8 hours is used to numb the foot and decrease pain.  For the best pain control, take the pain medication every 4 hours for the first 2 days post op. On the third day pain medication can be taken as needed.   Post Op Nausea Nausea is common after surgery, so it is managed proactively.  If prescribed, use the prescribed nausea medication regularly for the first 2 days post op.  Bandages Do not worry if there is blood on the bandage. What looks like a lot of blood on the bandage is actually a small amount. Blood on the dressing spreads out as it is absorbed by the gauze, the same way a drop of water spreads out on a paper towel.  If the bandages feel wet or dry, stiff and uncomfortable, call the office during office hours and we will schedule a time for you to have the bandage changed.  Unless you are specifically told otherwise, we will do the first bandage change in the office.  Keep your bandage dry. If the bandage becomes wet or soiled, notify the office and we will schedule a time to change the bandage.  Activity It is best to spend most of the first 2 days after surgery lying down with the foot elevated above the level of your heart. You may put weight on your heel while wearing the surgical shoe.   You may only get up to go to the restroom.  Driving Do not drive until you are able to respond in an emergency (i.e. slam on the brakes). This usually occurs after the bone has healed - 6 to 8 weeks.  Call the Office If you have a fever over 101F.  If you have increasing pain after the initial post op pain has settled down.  If you have increasing redness, swelling, or  drainage.  If you have any questions or concerns.     Monitored Anesthesia Care, Care After These instructions provide you with information about caring for yourself after your procedure. Your health care provider may also give you more specific instructions. Your treatment has been planned according to current medical practices, but problems sometimes occur. Call your health care provider if you have any problems or questions after your procedure. What can I expect after the procedure? After your procedure, you may: Feel sleepy for several hours. Feel clumsy and have poor balance for several hours. Feel forgetful about what happened after the procedure. Have poor judgment for several hours. Feel nauseous or vomit. Have a sore throat if you had a breathing tube during the procedure. Follow these instructions at home: For at least 24 hours after the procedure:     Have a responsible adult stay with you. It is important to have someone help care for you until you are awake and alert. Rest as needed. Do not: Participate in activities in which you could fall or become injured. Drive. Use heavy machinery. Drink alcohol. Take sleeping pills or medicines that cause drowsiness. Make important decisions or sign legal documents. Take care of children on your own. Eating and drinking Follow the diet that is recommended by your health care   provider. If you vomit, drink water, juice, or soup when you can drink without vomiting. Make sure you have little or no nausea before eating solid foods. General instructions Take over-the-counter and prescription medicines only as told by your health care provider. If you have sleep apnea, surgery and certain medicines can increase your risk for breathing problems. Follow instructions from your health care provider about wearing your sleep device: Anytime you are sleeping, including during daytime naps. While taking prescription pain medicines, sleeping  medicines, or medicines that make you drowsy. If you smoke, do not smoke without supervision. Keep all follow-up visits as told by your health care provider. This is important. Contact a health care provider if: You keep feeling nauseous or you keep vomiting. You feel light-headed. You develop a rash. You have a fever. Get help right away if: You have trouble breathing. Summary For several hours after your procedure, you may feel sleepy and have poor judgment. Have a responsible adult stay with you for at least 24 hours or until you are awake and alert. This information is not intended to replace advice given to you by your health care provider. Make sure you discuss any questions you have with your health care provider. Document Revised: 09/10/2017 Document Reviewed: 10/03/2015 Elsevier Patient Education  2020 [employer]  

## 2019-12-25 ENCOUNTER — Encounter (HOSPITAL_COMMUNITY): Payer: Self-pay | Admitting: Podiatry

## 2019-12-29 LAB — AEROBIC/ANAEROBIC CULTURE W GRAM STAIN (SURGICAL/DEEP WOUND)
Culture: NO GROWTH
Gram Stain: NONE SEEN

## 2019-12-30 LAB — SURGICAL PATHOLOGY

## 2020-02-12 ENCOUNTER — Other Ambulatory Visit: Payer: Self-pay | Admitting: Podiatry

## 2020-02-12 NOTE — Patient Instructions (Addendum)
Theodore Long  02/12/2020     @   Your procedure is scheduled on  02/18/2020.  Report to [facility] at  1145  A.M.  Call this number if you have problems the morning of surgery:  [phone]   Remember:  Do not eat or drink after midnight.                         Take these medicines the morning of surgery with A SIP OF WATER  Adderall, atenolol, flexeril(if needed), prozac, levothyroxine, protonix, zanaflex.    Do not wear jewelry, make-up or nail polish.  Do not wear lotions, powders, or perfumes. Please wear deodorant and brush your teeth.  Do not shave 48 hours prior to surgery.  Men may shave face and neck.  Do not bring valuables to the hospital.  [facility] is not responsible for any belongings or valuables.  Contacts, dentures or bridgework may not be worn into surgery.  Leave your suitcase in the car.  After surgery it may be brought to your room.  For patients admitted to the hospital, discharge time will be determined by your treatment team.  Patients discharged the day of surgery will not be allowed to drive home.   Name and phone number of your driver:   family Special instructions:  DO NOT smoke the morning of your procedure.  Please read over the following fact sheets that you were given. Anesthesia Post-op Instructions and Care and Recovery After Surgery       Toe Deformity Repair, Care After This sheet gives you information about how to care for yourself after your procedure. Your health care provider may also give you more specific instructions. If you have problems or questions, contact your health care provider. What can I expect after the procedure? After the procedure, it is common to have:  Pain in the affected area.  Discomfort with walking. Follow these instructions at home: If you have a post-operative shoe:   Wear the shoe as told by your health care provider. Remove it only as told by your health care  provider.  Loosen the shoe if your toes tingle, become numb, or turn cold and blue.  Keep the shoe clean and dry. Bathing  Do not take baths, swim, or use a hot tub until your health care provider approves. Ask your health care provider if you can take showers. You may only be allowed to take sponge baths.  If your post-operative shoe is not waterproof, cover it with a watertight covering when you take a bath or a shower.  Keep the bandage (dressing) dry until your health care provider says it can be removed. Incision care   Follow instructions from your health care provider about how to take care of your incision. Make sure you: ? Wash your hands with soap and water before you change your dressing. If soap and water are not available, use hand sanitizer. ? Change your dressing as told by your health care provider. ? Leave stitches (sutures), skin glue, or adhesive strips in place. These skin closures may need to stay in place for 2 weeks or longer.  Check your incision area every day for signs of infection. Check for: ? Redness, swelling, or pain. ? Fluid or blood. ? Warmth. ? Pus or a bad smell. Managing pain, stiffness, and swelling   If directed, put ice on the affected area. ?  Put ice in a plastic bag. ? Place a towel between your skin and the bag. ? Leave the ice on for 20 minutes, 2-3 times a day.  Raise (elevate) the affected foot above the level of your heart while you are sitting or lying down. Driving  Do not drive or use heavy machinery while taking prescription pain medicine.  Ask your health care provider when it is safe to drive if you have a post-operative shoe on your foot. Activity  Walk and return to your normal activities as told by your health care provider. Ask your health care provider what activities are safe for you.  Do not use your affected foot to support your body weight until your health care provider says that you can. Use crutches as directed  by your health care provider.  Do exercises as told by your health care provider or physical therapist. General instructions  Take over-the-counter and prescription medicines only as told by your health care provider.  If you are taking prescription pain medicine, take actions to prevent or treat constipation. Your health care provider may recommend that you: ? Drink enough fluid to keep your urine pale yellow. ? Eat foods that are high in fiber, such as fresh fruits and vegetables, whole grains, and beans. ? Limit foods that are high in fat and processed sugars, such as fried or sweet foods. ? Take an over-the-counter or prescription medicine for constipation.  Do not use any products that contain nicotine or tobacco, such as cigarettes and e-cigarettes. These can delay bone healing. If you need help quitting, ask your health care provider.  Keep all follow-up visits as told by your health care provider. This is important. Contact a health care provider if:  You have redness, swelling, or pain at your incision site.  You have fluid or blood coming from your incision.  Your incision feels warm to the touch.  You have pus or a bad smell coming from the incision area or the dressing.  You have a fever. Get help right away if:  You develop a rash.  You have difficulty breathing. Summary  After the procedure, it is common to have pain in the affected area and discomfort with walking.  Follow instructions from your health care provider about how to take care of your incision.  Walk and return to your normal activities as told by your health care provider. Ask your health care provider what activities are safe for you.  Keep all follow-up visits as told by your health care provider. This information is not intended to replace advice given to you by your health care provider. Make sure you discuss any questions you have with your health care provider. Document Revised: 10/02/2018  Document Reviewed: 02/20/2017 Elsevier Patient Education  2020 [employer]    Monitored Anesthesia Care, Care After These instructions provide you with information about caring for yourself after your procedure. Your health care provider may also give you more specific instructions. Your treatment has been planned according to current medical practices, but problems sometimes occur. Call your health care provider if you have any problems or questions after your procedure. What can I expect after the procedure? After your procedure, you may:  Feel sleepy for several hours.  Feel clumsy and have poor balance for several hours.  Feel forgetful about what happened after the procedure.  Have poor judgment for several hours.  Feel nauseous or vomit.  Have a sore throat if you had a breathing tube  during the procedure. Follow these instructions at home: For at least 24 hours after the procedure:      Have a responsible adult stay with you. It is important to have someone help care for you until you are awake and alert.  Rest as needed.  Do not: ? Participate in activities in which you could fall or become injured. ? Drive. ? Use heavy machinery. ? Drink alcohol. ? Take sleeping pills or medicines that cause drowsiness. ? Make important decisions or sign legal documents. ? Take care of children on your own. Eating and drinking  Follow the diet that is recommended by your health care provider.  If you vomit, drink water, juice, or soup when you can drink without vomiting.  Make sure you have little or no nausea before eating solid foods. General instructions  Take over-the-counter and prescription medicines only as told by your health care provider.  If you have sleep apnea, surgery and certain medicines can increase your risk for breathing problems. Follow instructions from your health care provider about wearing your sleep device: ? Anytime you are sleeping, including  during daytime naps. ? While taking prescription pain medicines, sleeping medicines, or medicines that make you drowsy.  If you smoke, do not smoke without supervision.  Keep all follow-up visits as told by your health care provider. This is important. Contact a health care provider if:  You keep feeling nauseous or you keep vomiting.  You feel light-headed.  You develop a rash.  You have a fever. Get help right away if:  You have trouble breathing. Summary  For several hours after your procedure, you may feel sleepy and have poor judgment.  Have a responsible adult stay with you for at least 24 hours or until you are awake and alert. This information is not intended to replace advice given to you by your health care provider. Make sure you discuss any questions you have with your health care provider. Document Revised: 09/10/2017 Document Reviewed: 10/03/2015 Elsevier Patient Education  2020 [employer]. How to Use Chlorhexidine for Bathing Chlorhexidine gluconate (CHG) is a germ-killing (antiseptic) solution that is used to clean the skin. It can get rid of the bacteria that normally live on the skin and can keep them away for about 24 hours. To clean your skin with CHG, you may be given:  A CHG solution to use in the shower or as part of a sponge bath.  A prepackaged cloth that contains CHG. Cleaning your skin with CHG may help lower the risk for infection:  While you are staying in the intensive care unit of the hospital.  If you have a vascular access, such as a central line, to provide short-term or long-term access to your veins.  If you have a catheter to drain urine from your bladder.  If you are on a ventilator. A ventilator is a machine that helps you breathe by moving air in and out of your lungs.  After surgery. What are the risks? Risks of using CHG include:  A skin reaction.  Hearing loss, if CHG gets in your ears.  Eye injury, if CHG gets in your  eyes and is not rinsed out.  The CHG product catching fire. Make sure that you avoid smoking and flames after applying CHG to your skin. Do not use CHG:  If you have a chlorhexidine allergy or have previously reacted to chlorhexidine.  On babies younger than 65 months of age. How to use CHG solution  Use CHG only as told by your health care provider, and follow the instructions on the label.  Use the full amount of CHG as directed. Usually, this is one bottle. During a shower Follow these steps when using CHG solution during a shower (unless your health care provider gives you different instructions): 1. Start the shower. 2. Use your normal soap and shampoo to wash your face and hair. 3. Turn off the shower or move out of the shower stream. 4. Pour the CHG onto a clean washcloth. Do not use any type of brush or rough-edged sponge. 5. Starting at your neck, lather your body down to your toes. Make sure you follow these instructions: ? If you will be having surgery, pay special attention to the part of your body where you will be having surgery. Scrub this area for at least 1 minute. ? Do not use CHG on your head or face. If the solution gets into your ears or eyes, rinse them well with water. ? Avoid your genital area. ? Avoid any areas of skin that have broken skin, cuts, or scrapes. ? Scrub your back and under your arms. Make sure to wash skin folds. 6. Let the lather sit on your skin for 1-2 minutes or as long as told by your health care provider. 7. Thoroughly rinse your entire body in the shower. Make sure that all body creases and crevices are rinsed well. 8. Dry off with a clean towel. Do not put any substances on your body afterward--such as powder, lotion, or perfume--unless you are told to do so by your health care provider. Only use lotions that are recommended by the manufacturer. 9. Put on clean clothes or pajamas. 10. If it is the night before your surgery, sleep in clean  sheets.  During a sponge bath Follow these steps when using CHG solution during a sponge bath (unless your health care provider gives you different instructions): 1. Use your normal soap and shampoo to wash your face and hair. 2. Pour the CHG onto a clean washcloth. 3. Starting at your neck, lather your body down to your toes. Make sure you follow these instructions: ? If you will be having surgery, pay special attention to the part of your body where you will be having surgery. Scrub this area for at least 1 minute. ? Do not use CHG on your head or face. If the solution gets into your ears or eyes, rinse them well with water. ? Avoid your genital area. ? Avoid any areas of skin that have broken skin, cuts, or scrapes. ? Scrub your back and under your arms. Make sure to wash skin folds. 4. Let the lather sit on your skin for 1-2 minutes or as long as told by your health care provider. 5. Using a different clean, wet washcloth, thoroughly rinse your entire body. Make sure that all body creases and crevices are rinsed well. 6. Dry off with a clean towel. Do not put any substances on your body afterward--such as powder, lotion, or perfume--unless you are told to do so by your health care provider. Only use lotions that are recommended by the manufacturer. 7. Put on clean clothes or pajamas. 8. If it is the night before your surgery, sleep in clean sheets. How to use CHG prepackaged cloths  Only use CHG cloths as told by your health care provider, and follow the instructions on the label.  Use the CHG cloth on clean, dry skin.  Do not use  the CHG cloth on your head or face unless your health care provider tells you to.  When washing with the CHG cloth: ? Avoid your genital area. ? Avoid any areas of skin that have broken skin, cuts, or scrapes. Before surgery Follow these steps when using a CHG cloth to clean before surgery (unless your health care provider gives you different  instructions): 1. Using the CHG cloth, vigorously scrub the part of your body where you will be having surgery. Scrub using a back-and-forth motion for 3 minutes. The area on your body should be completely wet with CHG when you are done scrubbing. 2. Do not rinse. Discard the cloth and let the area air-dry. Do not put any substances on the area afterward, such as powder, lotion, or perfume. 3. Put on clean clothes or pajamas. 4. If it is the night before your surgery, sleep in clean sheets.  For general bathing Follow these steps when using CHG cloths for general bathing (unless your health care provider gives you different instructions). 1. Use a separate CHG cloth for each area of your body. Make sure you wash between any folds of skin and between your fingers and toes. Wash your body in the following order, switching to a new cloth after each step: ? The front of your neck, shoulders, and chest. ? Both of your arms, under your arms, and your hands. ? Your stomach and groin area, avoiding the genitals. ? Your right leg and foot. ? Your left leg and foot. ? The back of your neck, your back, and your buttocks. 2. Do not rinse. Discard the cloth and let the area air-dry. Do not put any substances on your body afterward--such as powder, lotion, or perfume--unless you are told to do so by your health care provider. Only use lotions that are recommended by the manufacturer. 3. Put on clean clothes or pajamas. Contact a health care provider if:  Your skin gets irritated after scrubbing.  You have questions about using your solution or cloth. Get help right away if:  Your eyes become very red or swollen.  Your eyes itch badly.  Your skin itches badly and is red or swollen.  Your hearing changes.  You have trouble seeing.  You have swelling or tingling in your mouth or throat.  You have trouble breathing.  You swallow any chlorhexidine. Summary  Chlorhexidine gluconate (CHG) is a  germ-killing (antiseptic) solution that is used to clean the skin. Cleaning your skin with CHG may help to lower your risk for infection.  You may be given CHG to use for bathing. It may be in a bottle or in a prepackaged cloth to use on your skin. Carefully follow your health care provider's instructions and the instructions on the product label.  Do not use CHG if you have a chlorhexidine allergy.  Contact your health care provider if your skin gets irritated after scrubbing. This information is not intended to replace advice given to you by your health care provider. Make sure you discuss any questions you have with your health care provider. Document Revised: 08/29/2018 Document Reviewed: 05/10/2017 Elsevier Patient Education  2020 [employer].

## 2020-02-16 ENCOUNTER — Other Ambulatory Visit (HOSPITAL_COMMUNITY)
Admission: RE | Admit: 2020-02-16 | Discharge: 2020-02-16 | Disposition: A | Discharge status: Home / Self Care | Payer: Medicare Other | Source: Ambulatory Visit | Attending: Podiatry | Admitting: Podiatry

## 2020-02-16 ENCOUNTER — Encounter (HOSPITAL_COMMUNITY)
Admission: RE | Admit: 2020-02-16 | Discharge: 2020-02-16 | Disposition: A | Discharge status: Home / Self Care | Payer: Medicare Other | Source: Ambulatory Visit | Attending: Podiatry | Admitting: Podiatry

## 2020-02-16 ENCOUNTER — Other Ambulatory Visit (HOSPITAL_COMMUNITY): Payer: Self-pay | Admitting: Podiatry

## 2020-02-16 ENCOUNTER — Other Ambulatory Visit: Payer: Self-pay

## 2020-02-16 ENCOUNTER — Ambulatory Visit (HOSPITAL_COMMUNITY)
Admission: RE | Admit: 2020-02-16 | Discharge: 2020-02-16 | Disposition: A | Discharge status: Home / Self Care | Payer: Medicare Other | Source: Ambulatory Visit | Attending: Podiatry | Admitting: Podiatry

## 2020-02-16 DIAGNOSIS — Z20822 Contact with and (suspected) exposure to covid-19: Secondary | ICD-10-CM | POA: Insufficient documentation

## 2020-02-16 DIAGNOSIS — Z01818 Encounter for other preprocedural examination: Secondary | ICD-10-CM | POA: Diagnosis not present

## 2020-02-16 DIAGNOSIS — M2041 Other hammer toe(s) (acquired), right foot: Secondary | ICD-10-CM | POA: Diagnosis not present

## 2020-02-16 DIAGNOSIS — Z01812 Encounter for preprocedural laboratory examination: Secondary | ICD-10-CM | POA: Insufficient documentation

## 2020-02-16 LAB — BASIC METABOLIC PANEL
Anion gap: 8 (ref 5–15)
BUN: 19 mg/dL (ref 8–23)
CO2: 27 mmol/L (ref 22–32)
Calcium: 9 mg/dL (ref 8.9–10.3)
Chloride: 102 mmol/L (ref 98–111)
Creatinine, Ser: 0.96 mg/dL (ref 0.61–1.24)
GFR calc Af Amer: >60 mL/min (ref >60)
GFR calc non Af Amer: >60 mL/min (ref >60)
Glucose, Bld: 195 mg/dL — ABNORMAL HIGH (ref 70–99)
Potassium: 4.2 mmol/L (ref 3.5–5.1)
Sodium: 137 mmol/L (ref 135–145)

## 2020-02-16 LAB — SARS CORONAVIRUS 2 (TAT 6-24 HRS): SARS Coronavirus 2: NEGATIVE

## 2020-02-17 LAB — HEMOGLOBIN A1C
Hgb A1c MFr Bld: 7 % — ABNORMAL HIGH (ref 4.8–5.6)
Mean Plasma Glucose: 154 mg/dL

## 2020-02-17 NOTE — Pre-Procedure Instructions (Signed)
Hgba1C routed to PCP 

## 2020-02-18 ENCOUNTER — Encounter (HOSPITAL_COMMUNITY): Payer: Self-pay

## 2020-02-18 ENCOUNTER — Encounter (HOSPITAL_COMMUNITY)
Admission: RE | Disposition: A | Discharge status: Home / Self Care | Payer: Self-pay | Source: Home / Self Care | Attending: Podiatry

## 2020-02-18 ENCOUNTER — Ambulatory Visit (HOSPITAL_COMMUNITY): Payer: Medicare Other | Admitting: Anesthesiology

## 2020-02-18 ENCOUNTER — Ambulatory Visit (HOSPITAL_COMMUNITY): Payer: Medicare Other

## 2020-02-18 ENCOUNTER — Ambulatory Visit (HOSPITAL_COMMUNITY)
Admission: RE | Admit: 2020-02-18 | Discharge: 2020-02-18 | Disposition: A | Discharge status: Home / Self Care | Payer: Medicare Other | Attending: Podiatry | Admitting: Podiatry

## 2020-02-18 DIAGNOSIS — Z79890 Hormone replacement therapy (postmenopausal): Secondary | ICD-10-CM | POA: Diagnosis not present

## 2020-02-18 DIAGNOSIS — K219 Gastro-esophageal reflux disease without esophagitis: Secondary | ICD-10-CM | POA: Insufficient documentation

## 2020-02-18 DIAGNOSIS — M1990 Unspecified osteoarthritis, unspecified site: Secondary | ICD-10-CM | POA: Diagnosis not present

## 2020-02-18 DIAGNOSIS — I10 Essential (primary) hypertension: Secondary | ICD-10-CM | POA: Diagnosis not present

## 2020-02-18 DIAGNOSIS — E079 Disorder of thyroid, unspecified: Secondary | ICD-10-CM | POA: Insufficient documentation

## 2020-02-18 DIAGNOSIS — E785 Hyperlipidemia, unspecified: Secondary | ICD-10-CM | POA: Diagnosis not present

## 2020-02-18 DIAGNOSIS — F419 Anxiety disorder, unspecified: Secondary | ICD-10-CM | POA: Diagnosis not present

## 2020-02-18 DIAGNOSIS — E119 Type 2 diabetes mellitus without complications: Secondary | ICD-10-CM | POA: Diagnosis not present

## 2020-02-18 DIAGNOSIS — J449 Chronic obstructive pulmonary disease, unspecified: Secondary | ICD-10-CM | POA: Insufficient documentation

## 2020-02-18 DIAGNOSIS — Z89421 Acquired absence of other right toe(s): Secondary | ICD-10-CM | POA: Insufficient documentation

## 2020-02-18 DIAGNOSIS — F319 Bipolar disorder, unspecified: Secondary | ICD-10-CM | POA: Insufficient documentation

## 2020-02-18 DIAGNOSIS — Z9889 Other specified postprocedural states: Secondary | ICD-10-CM

## 2020-02-18 DIAGNOSIS — Z79899 Other long term (current) drug therapy: Secondary | ICD-10-CM | POA: Diagnosis not present

## 2020-02-18 DIAGNOSIS — M2041 Other hammer toe(s) (acquired), right foot: Secondary | ICD-10-CM | POA: Insufficient documentation

## 2020-02-18 DIAGNOSIS — Z7984 Long term (current) use of oral hypoglycemic drugs: Secondary | ICD-10-CM | POA: Diagnosis not present

## 2020-02-18 HISTORY — PX: FOOT ARTHRODESIS: SHX1655

## 2020-02-18 LAB — GLUCOSE, CAPILLARY
Glucose-Capillary: 106 mg/dL — ABNORMAL HIGH (ref 70–99)
Glucose-Capillary: 78 mg/dL (ref 70–99)
Glucose-Capillary: 83 mg/dL (ref 70–99)

## 2020-02-18 SURGERY — FUSION, JOINT, FOOT
Anesthesia: General | Site: Foot | Laterality: Right

## 2020-02-18 MED ORDER — CEFAZOLIN SODIUM-DEXTROSE 2-4 GM/100ML-% IV SOLN
2.0000 g | Freq: Once | INTRAVENOUS | Status: AC
  Administered 2020-02-18: 2 g via INTRAVENOUS
  Filled 2020-02-18: qty 100

## 2020-02-18 MED ORDER — ONDANSETRON HCL 4 MG/2ML IJ SOLN
INTRAMUSCULAR | Status: DC | PRN
  Administered 2020-02-18: 4 mg via INTRAVENOUS

## 2020-02-18 MED ORDER — CHLORHEXIDINE GLUCONATE 0.12 % MT SOLN
15.0000 mL | Freq: Once | OROMUCOSAL | Status: AC
  Administered 2020-02-18: 15 mL via OROMUCOSAL

## 2020-02-18 MED ORDER — LACTATED RINGERS IV SOLN: INTRAVENOUS | Status: DC

## 2020-02-18 MED ORDER — BUPIVACAINE HCL (PF) 0.5 % IJ SOLN
INTRAMUSCULAR | Status: AC
  Filled 2020-02-18: qty 30

## 2020-02-18 MED ORDER — LIDOCAINE HCL (PF) 1 % IJ SOLN
INTRAMUSCULAR | Status: AC
  Filled 2020-02-18: qty 30

## 2020-02-18 MED ORDER — PROPOFOL 10 MG/ML IV BOLUS
INTRAVENOUS | Status: AC
  Filled 2020-02-18: qty 40

## 2020-02-18 MED ORDER — MIDAZOLAM HCL 2 MG/2ML IJ SOLN
INTRAMUSCULAR | Status: AC
  Filled 2020-02-18: qty 2

## 2020-02-18 MED ORDER — LIDOCAINE 2% (20 MG/ML) 5 ML SYRINGE
INTRAMUSCULAR | Status: AC
  Filled 2020-02-18: qty 5

## 2020-02-18 MED ORDER — ONDANSETRON HCL 4 MG/2ML IJ SOLN
INTRAMUSCULAR | Status: AC
  Filled 2020-02-18: qty 2

## 2020-02-18 MED ORDER — SODIUM CHLORIDE 0.9 % IR SOLN
Status: DC | PRN
  Administered 2020-02-18: 1000 mL

## 2020-02-18 MED ORDER — FENTANYL CITRATE (PF) 100 MCG/2ML IJ SOLN
INTRAMUSCULAR | Status: DC | PRN
Start: 2020-02-18 — End: 2020-02-18
  Administered 2020-02-18: 50 ug via INTRAVENOUS

## 2020-02-18 MED ORDER — PROPOFOL 500 MG/50ML IV EMUL
INTRAVENOUS | Status: DC | PRN
  Administered 2020-02-18: 75 ug/kg/min via INTRAVENOUS

## 2020-02-18 MED ORDER — FENTANYL CITRATE (PF) 250 MCG/5ML IJ SOLN
INTRAMUSCULAR | Status: AC
  Filled 2020-02-18: qty 5

## 2020-02-18 MED ORDER — LIDOCAINE HCL 1 % IJ SOLN
INTRAMUSCULAR | Status: DC | PRN
  Administered 2020-02-18: 8 mL via INTRAMUSCULAR
  Administered 2020-02-18: 10 mL via INTRAMUSCULAR

## 2020-02-18 MED ORDER — MIDAZOLAM HCL 5 MG/5ML IJ SOLN
INTRAMUSCULAR | Status: DC | PRN
  Administered 2020-02-18: 2 mg via INTRAVENOUS

## 2020-02-18 MED ORDER — PROPOFOL 10 MG/ML IV BOLUS
INTRAVENOUS | Status: DC | PRN
  Administered 2020-02-18 (×3): 20 mg via INTRAVENOUS

## 2020-02-18 SURGICAL SUPPLY — 54 items
1.6mm k wire ×3 IMPLANT
APL SKNCLS STERI-STRIP NONHPOA (GAUZE/BANDAGES/DRESSINGS) ×1
BANDAGE ELASTIC 4 VELCRO NS (GAUZE/BANDAGES/DRESSINGS) ×3 IMPLANT
BANDAGE ESMARK 4X12 BL STRL LF (DISPOSABLE) ×1 IMPLANT
BENZOIN TINCTURE PRP APPL 2/3 (GAUZE/BANDAGES/DRESSINGS) ×3 IMPLANT
BLADE AVERAGE 25MMX9MM (BLADE) ×1
BLADE AVERAGE 25X9 (BLADE) ×2 IMPLANT
BLADE SURG 15 STRL LF DISP TIS (BLADE) ×2 IMPLANT
BLADE SURG 15 STRL SS (BLADE) ×6
BNDG CMPR 12X4 ELC STRL LF (DISPOSABLE) ×1
BNDG CMPR STD VLCR NS LF 5.8X4 (GAUZE/BANDAGES/DRESSINGS) ×1
BNDG CONFORM 2 STRL LF (GAUZE/BANDAGES/DRESSINGS) ×3 IMPLANT
BNDG ELASTIC 4X5.8 VLCR NS LF (GAUZE/BANDAGES/DRESSINGS) ×3 IMPLANT
BNDG ESMARK 4X12 BLUE STRL LF (DISPOSABLE) ×3
BNDG GAUZE ELAST 4 BULKY (GAUZE/BANDAGES/DRESSINGS) ×3 IMPLANT
BOOT STEPPER DURA LG (SOFTGOODS) IMPLANT
BOOT STEPPER DURA MED (SOFTGOODS) IMPLANT
CAP PIN ORTHO PINK (CAP) ×3 IMPLANT
CAP PIN PROTECTOR ORTHO WHT (CAP) IMPLANT
CLOSURE WOUND 1/2 X4 (GAUZE/BANDAGES/DRESSINGS) ×1
CLOTH BEACON ORANGE TIMEOUT ST (SAFETY) ×3 IMPLANT
COVER LIGHT HANDLE STERIS (MISCELLANEOUS) ×6 IMPLANT
COVER WAND RF STERILE (DRAPES) ×3 IMPLANT
CUFF TOURN SGL QUICK 18X4 (TOURNIQUET CUFF) ×3 IMPLANT
DECANTER SPIKE VIAL GLASS SM (MISCELLANEOUS) ×6 IMPLANT
DRAPE OEC MINIVIEW 54X84 (DRAPES) ×3 IMPLANT
DRSG ADAPTIC 3X8 NADH LF (GAUZE/BANDAGES/DRESSINGS) ×3 IMPLANT
DURAPREP 26ML APPLICATOR (WOUND CARE) ×3 IMPLANT
ELECT REM PT RETURN 9FT ADLT (ELECTROSURGICAL) ×3
ELECTRODE REM PT RTRN 9FT ADLT (ELECTROSURGICAL) ×1 IMPLANT
GAUZE SPONGE 4X4 12PLY STRL (GAUZE/BANDAGES/DRESSINGS) ×3 IMPLANT
GLOVE BIO SURGEON STRL SZ7.5 (GLOVE) ×3 IMPLANT
GLOVE BIOGEL PI IND STRL 7.0 (GLOVE) ×2 IMPLANT
GLOVE BIOGEL PI IND STRL 7.5 (GLOVE) ×1 IMPLANT
GLOVE BIOGEL PI INDICATOR 7.0 (GLOVE) ×4
GLOVE BIOGEL PI INDICATOR 7.5 (GLOVE) ×2
GLOVE ECLIPSE 6.5 STRL STRAW (GLOVE) ×3 IMPLANT
GLOVE ECLIPSE 7.0 STRL STRAW (GLOVE) ×6 IMPLANT
GOWN STRL REUS W/ TWL LRG LVL3 (GOWN DISPOSABLE) ×1 IMPLANT
GOWN STRL REUS W/TWL LRG LVL3 (GOWN DISPOSABLE) ×6 IMPLANT
K-WIRE SGL PT/SMTH 9X35 (WIRE) ×3
KIT TURNOVER KIT A (KITS) ×3 IMPLANT
KWIRE SGL PT/SMTH 9X35 (WIRE) ×1 IMPLANT
MANIFOLD NEPTUNE II (INSTRUMENTS) ×3 IMPLANT
NEEDLE HYPO 25X1 1.5 SAFETY (NEEDLE) ×15 IMPLANT
NS IRRIG 1000ML POUR BTL (IV SOLUTION) ×3 IMPLANT
PACK BASIC LIMB (CUSTOM PROCEDURE TRAY) ×3 IMPLANT
PAD ARMBOARD 7.5X6 YLW CONV (MISCELLANEOUS) ×3 IMPLANT
SET BASIN LINEN APH (SET/KITS/TRAYS/PACK) ×3 IMPLANT
STRIP CLOSURE SKIN 1/2X4 (GAUZE/BANDAGES/DRESSINGS) ×2 IMPLANT
SUT VIC AB 2-0 CT2 27 (SUTURE) ×3 IMPLANT
SUT VIC AB 4-0 PS2 27 (SUTURE) ×3 IMPLANT
SUT VICRYL AB 3-0 FS1 BRD 27IN (SUTURE) ×3 IMPLANT
SYR CONTROL 10ML LL (SYRINGE) ×6 IMPLANT

## 2020-02-18 NOTE — Anesthesia Postprocedure Evaluation (Signed)
Anesthesia Post Note  Patient: Theodore Long  Procedure(s) Performed: ARTHRODESIS FOURTH TOE OF RIGHT FOOT (Right Foot)  Patient location during evaluation: PACU Anesthesia Type: General Level of consciousness: awake and alert and oriented Pain management: pain level controlled Vital Signs Assessment: post-procedure vital signs reviewed and stable Respiratory status: spontaneous breathing Cardiovascular status: blood pressure returned to baseline and stable Postop Assessment: no apparent nausea or vomiting Anesthetic complications: no   No complications documented.   Last Vitals:  Vitals:   02/18/20 0644 02/18/20 0900  BP: 137/62 (!) 149/74  Resp: 12 (!) 8  Temp: 36.6 C   SpO2: 99% 96%    Last Pain:  Vitals:   02/18/20 0644  TempSrc: Oral  PainSc: 3                  Carrel Leather

## 2020-02-18 NOTE — Discharge Instructions (Signed)
.These instructions will give you an idea of what to expect after surgery and how to manage issues that may arise before your first post op office visit.  Pain Management Pain is best managed by "staying ahead" of it. If pain gets out of control, it is difficult to get it back under control. Local anesthesia that lasts 6-8 hours is used to numb the foot and decrease pain.  For the best pain control, take the pain medication every 4 hours for the first 2 days post op. On the third day pain medication can be taken as needed.   Post Op Nausea Nausea is common after surgery, so it is managed proactively.  If prescribed, use the prescribed nausea medication regularly for the first 2 days post op.  Bandages Do not worry if there is blood on the bandage. What looks like a lot of blood on the bandage is actually a small amount. Blood on the dressing spreads out as it is absorbed by the gauze, the same way a drop of water spreads out on a paper towel.  If the bandages feel wet or dry, stiff and uncomfortable, call the office during office hours and we will schedule a time for you to have the bandage changed.  Unless you are specifically told otherwise, we will do the first bandage change in the office.  Keep your bandage dry. If the bandage becomes wet or soiled, notify the office and we will schedule a time to change the bandage.  Activity It is best to spend most of the first 2 days after surgery lying down with the foot elevated above the level of your heart. You may put weight on your heel while wearing the surgical shoe.   You may only get up to go to the restroom.  Driving Do not drive until you are able to respond in an emergency (i.e. slam on the brakes). This usually occurs after the bone has healed - 6 to 8 weeks.  Call the Office If you have a fever over 101F.  If you have increasing pain after the initial post op pain has settled down.  If you have increasing redness, swelling, or  drainage.  If you have any questions or concerns.   General Anesthesia, Adult, Care After This sheet gives you information about how to care for yourself after your procedure. Your health care provider may also give you more specific instructions. If you have problems or questions, contact your health care provider. What can I expect after the procedure? After the procedure, the following side effects are common:  Pain or discomfort at the IV site.  Nausea.  Vomiting.  Sore throat.  Trouble concentrating.  Feeling cold or chills.  Weak or tired.  Sleepiness and fatigue.  Soreness and body aches. These side effects can affect parts of the body that were not involved in surgery. Follow these instructions at home:  For at least 24 hours after the procedure:  Have a responsible adult stay with you. It is important to have someone help care for you until you are awake and alert.  Rest as needed.  Do not: ? Participate in activities in which you could fall or become injured. ? Drive. ? Use heavy machinery. ? Drink alcohol. ? Take sleeping pills or medicines that cause drowsiness. ? Make important decisions or sign legal documents. ? Take care of children on your own. Eating and drinking  Follow any instructions from your health care provider about eating or   drinking restrictions.  When you feel hungry, start by eating small amounts of foods that are soft and easy to digest (bland), such as toast. Gradually return to your regular diet.  Drink enough fluid to keep your urine pale yellow.  If you vomit, rehydrate by drinking water, juice, or clear broth. General instructions  If you have sleep apnea, surgery and certain medicines can increase your risk for breathing problems. Follow instructions from your health care provider about wearing your sleep device: ? Anytime you are sleeping, including during daytime naps. ? While taking prescription pain medicines, sleeping  medicines, or medicines that make you drowsy.  Return to your normal activities as told by your health care provider. Ask your health care provider what activities are safe for you.  Take over-the-counter and prescription medicines only as told by your health care provider.  If you smoke, do not smoke without supervision.  Keep all follow-up visits as told by your health care provider. This is important. Contact a health care provider if:  You have nausea or vomiting that does not get better with medicine.  You cannot eat or drink without vomiting.  You have pain that does not get better with medicine.  You are unable to pass urine.  You develop a skin rash.  You have a fever.  You have redness around your IV site that gets worse. Get help right away if:  You have difficulty breathing.  You have chest pain.  You have blood in your urine or stool, or you vomit blood. Summary  After the procedure, it is common to have a sore throat or nausea. It is also common to feel tired.  Have a responsible adult stay with you for the first 24 hours after general anesthesia. It is important to have someone help care for you until you are awake and alert.  When you feel hungry, start by eating small amounts of foods that are soft and easy to digest (bland), such as toast. Gradually return to your regular diet.  Drink enough fluid to keep your urine pale yellow.  Return to your normal activities as told by your health care provider. Ask your health care provider what activities are safe for you. This information is not intended to replace advice given to you by your health care provider. Make sure you discuss any questions you have with your health care provider. Document Revised: 06/15/2017 Document Reviewed: 01/26/2017 Elsevier Patient Education  2020 [employer] Inc[employer]  

## 2020-02-18 NOTE — Op Note (Signed)
02/18/2020  8:56 AM  PATIENT:  Theodore Long  64 y.o. male  PRE-OPERATIVE DIAGNOSIS:  HAMMER TOE FOURTH RIGHT FOOT  POST-OPERATIVE DIAGNOSIS:  HAMMER TOE FOURTH RIGHT FOOT  PROCEDURE:  Procedure(s) with comments: ARTHRODESIS FOURTH TOE OF RIGHT FOOT (Right) - pt knows to arrive at 6:15  SURGEON:  Surgeon(s) and Role:    * Erskine Emery, DPM - Primary  ASSISTANTS: None  ANESTHESIA:  Local with IV sedation.   EBL: none.   MATERIALS: 6'2 K wire, 3-0 Vicryl, 3-0 Prolene.   LOCAL MEDICATIONS USED:  MARCAINE   , LIDOCAINE  and Amount: 10 ml pre op, 50ml Post op  SPECIMEN:  No Specimen  TOURNIQUET:   Total Tourniquet Time Documented: Calf (Right) - 37 minutes Total: Calf (Right) - 37 minutes  Patient was brought into the operating room laid supine on the operating table. Ankle tourniquet was applied to the surgical extremity. Following IV sedation, a local block was achieved using 10 cc of mixture of 1% plain lidocaine with 0.5% marcaine. The foot was the prepped, scrubbed and draped in aseptic manner. Using an esmarch band the tourniquet on the surgical site was inflatted at .   Attention was directed over the right fourth proximal interphalangeal joint. 4 cm linear incision was marked over the 4th toe starting from DIPJ to the PIPJ level. Using #15 blade skin incision was made. The incision was then deepened to the level of the extensor tendon. Care was made to retract all neurovascular bundle. At this time transverse tenotomy of the extenson tendon was performed at the proximal phalanx head. The tendon was reflected back. At this time the head of the proximal phalanx was freed and using a saw, the head of proximal phalanx was removed. The base of the middle phalanx was resected using sagittal saw. Power rasp was used to rasp down sharp edges from the proximal and middle phalanx. At this the guide hole was created in the center aspect of the proximal phalanx using a 6'2  inch K-wire. The K wire was driven from the base of the middle phalanx out through the distal tip of the distal phalanx. The K wire was retrograded back through the predrilled hole in proximal phalanx to the base of the proximal phalanx. Fluroscopy was used at this time to visualize the K-wire. The K wire was now bend and cut at the tip of the toe. The wound was irrigated with normal saline. The extensor tendon was repaired using 3-0 Vicryl. Skin was closed with 3-0 prolene.  Tourniquet was deflated. Dry sterile dressing applied.  Patient will be partial weight bearing to heel in surgical shoe.

## 2020-02-18 NOTE — Anesthesia Preprocedure Evaluation (Addendum)
Anesthesia Evaluation  Patient identified by MRN, date of birth, ID band Patient awake    Reviewed: Allergy & Precautions, H&P , NPO status , Patient's Chart, lab work & pertinent test results, reviewed documented beta blocker date and time   Airway Mallampati: II  TM Distance: >3 FB Neck ROM: Full    Dental  (+) Teeth Intact   Pulmonary COPD,    breath sounds clear to auscultation       Cardiovascular Exercise Tolerance: Good hypertension, Pt. on medications and Pt. on home beta blockers  Rhythm:Regular Rate:Normal     Neuro/Psych Depression Bipolar Disorder    GI/Hepatic GERD  Medicated and Controlled,  Endo/Other  diabetes, Well Controlled  Renal/GU      Musculoskeletal   Abdominal   Peds  Hematology   Anesthesia Other Findings   Reproductive/Obstetrics                            Anesthesia Physical Anesthesia Plan  ASA: III  Anesthesia Plan: General   Post-op Pain Management:    Induction:   PONV Risk Score and Plan: Ondansetron and Propofol infusion  Airway Management Planned: Simple Face Mask  Additional Equipment:   Intra-op Plan:   Post-operative Plan:   Informed Consent: I have reviewed the patients History and Physical, chart, labs and discussed the procedure including the risks, benefits and alternatives for the proposed anesthesia with the patient or authorized representative who has indicated his/her understanding and acceptance.       Plan Discussed with:   Anesthesia Plan Comments:        Anesthesia Quick Evaluation

## 2020-02-18 NOTE — Brief Op Note (Signed)
02/18/2020  8:56 AM  PATIENT:  Theodore Long  64 y.o. male  PRE-OPERATIVE DIAGNOSIS:  HAMMER TOE FOURTH RIGHT FOOT  POST-OPERATIVE DIAGNOSIS:  HAMMER TOE FOURTH RIGHT FOOT  PROCEDURE:  Procedure(s) with comments: ARTHRODESIS FOURTH TOE OF RIGHT FOOT (Right) - pt knows to arrive at 6:15  SURGEON:  Surgeon(s) and Role:    * Erskine Emery, DPM - Primary  ASSISTANTS: None  ANESTHESIA:  Local with IV sedation.   EBL: none.   BLOOD ADMINISTERED:none  DRAINS: none   LOCAL MEDICATIONS USED:  MARCAINE   , LIDOCAINE  and Amount: 10 ml pre op, 32ml Post op  SPECIMEN:  No Specimen  DISPOSITION OF SPECIMEN:  N/A  COUNTS:  YES  TOURNIQUET:   Total Tourniquet Time Documented: Calf (Right) - 37 minutes Total: Calf (Right) - 37 minutes   DICTATION: .Reubin Milan Dictation  PLAN OF CARE: Discharge to home after PACU  PATIENT DISPOSITION:  PACU - hemodynamically stable.   Delay start of Pharmacological VTE agent (>24hrs) due to surgical blood loss or risk of bleeding: not applicable

## 2020-02-18 NOTE — Addendum Note (Signed)
Addendum  created 02/18/20 0936 by Windell Norfolk, MD   Order list changed

## 2020-02-18 NOTE — Transfer of Care (Signed)
Immediate Anesthesia Transfer of Care Note  Patient: Theodore Long  Procedure(s) Performed: ARTHRODESIS FOURTH TOE OF RIGHT FOOT (Right Foot)  Patient Location: PACU  Anesthesia Type:General  Level of Consciousness: awake  Airway & Oxygen Therapy: Patient Spontanous Breathing  Post-op Assessment: Report given to RN  Post vital signs: Reviewed  Last Vitals:  Vitals Value Taken Time  BP 149/74 02/18/20 0857  Temp    Pulse    Resp 8 02/18/20 0859  SpO2    Vitals shown include unvalidated device data.  Last Pain:  Vitals:   02/18/20 0644  TempSrc: Oral  PainSc: 3       Patients Stated Pain Goal: 6 (02/18/20 4010)  Complications: No complications documented.

## 2020-02-18 NOTE — H&P (Signed)
.   HISTORY AND PHYSICAL INTERVAL NOTE:  02/18/2020  7:21 AM  Theodore Long  has presented today for surgery, with the diagnosis of HAMMER TOE FOURTH RIGHT FOOT.  The various methods of treatment have been discussed with the patient.  No guarantees were given.  After consideration of risks, benefits and other options for treatment, the patient has consented to surgery.  I have reviewed the patients' chart and labs.    Patient Vitals for the past 24 hrs:  BP Temp Temp src Resp SpO2 Height Weight  02/18/20 0644 137/62 97.8 F (36.6 C) Oral 12 99 % 6\' 2"  (1.88 m) 91.6 kg    A history and physical examination was performed in my office.  The patient was reexamined.  There have been no changes to this history and physical examination.   , DPM

## 2020-02-19 ENCOUNTER — Encounter (HOSPITAL_COMMUNITY): Payer: Self-pay | Admitting: Podiatry

## 2020-04-07 ENCOUNTER — Other Ambulatory Visit: Payer: Self-pay

## 2020-04-07 ENCOUNTER — Emergency Department (HOSPITAL_COMMUNITY)
Admission: EM | Admit: 2020-04-07 | Discharge: 2020-04-07 | Disposition: A | Discharge status: Home / Self Care | Payer: Medicare Other | Attending: Emergency Medicine | Admitting: Emergency Medicine

## 2020-04-07 ENCOUNTER — Emergency Department (HOSPITAL_COMMUNITY): Payer: Medicare Other

## 2020-04-07 ENCOUNTER — Encounter (HOSPITAL_COMMUNITY): Payer: Self-pay

## 2020-04-07 DIAGNOSIS — M86171 Other acute osteomyelitis, right ankle and foot: Secondary | ICD-10-CM | POA: Insufficient documentation

## 2020-04-07 DIAGNOSIS — M869 Osteomyelitis, unspecified: Secondary | ICD-10-CM

## 2020-04-07 DIAGNOSIS — Z794 Long term (current) use of insulin: Secondary | ICD-10-CM | POA: Diagnosis not present

## 2020-04-07 DIAGNOSIS — E039 Hypothyroidism, unspecified: Secondary | ICD-10-CM | POA: Diagnosis not present

## 2020-04-07 DIAGNOSIS — Z79899 Other long term (current) drug therapy: Secondary | ICD-10-CM | POA: Insufficient documentation

## 2020-04-07 DIAGNOSIS — R799 Abnormal finding of blood chemistry, unspecified: Secondary | ICD-10-CM | POA: Insufficient documentation

## 2020-04-07 DIAGNOSIS — Z7984 Long term (current) use of oral hypoglycemic drugs: Secondary | ICD-10-CM | POA: Insufficient documentation

## 2020-04-07 DIAGNOSIS — I10 Essential (primary) hypertension: Secondary | ICD-10-CM | POA: Diagnosis not present

## 2020-04-07 DIAGNOSIS — E119 Type 2 diabetes mellitus without complications: Secondary | ICD-10-CM | POA: Insufficient documentation

## 2020-04-07 DIAGNOSIS — M79674 Pain in right toe(s): Secondary | ICD-10-CM | POA: Diagnosis present

## 2020-04-07 LAB — BASIC METABOLIC PANEL
Anion gap: 12 (ref 5–15)
BUN: 19 mg/dL (ref 8–23)
CO2: 29 mmol/L (ref 22–32)
Calcium: 9.9 mg/dL (ref 8.9–10.3)
Chloride: 95 mmol/L — ABNORMAL LOW (ref 98–111)
Creatinine, Ser: 0.99 mg/dL (ref 0.61–1.24)
GFR, Estimated: >60 mL/min (ref >60)
Glucose, Bld: 98 mg/dL (ref 70–99)
Potassium: 4.1 mmol/L (ref 3.5–5.1)
Sodium: 136 mmol/L (ref 135–145)

## 2020-04-07 LAB — CBC WITH DIFFERENTIAL/PLATELET
Abs Immature Granulocytes: 0.04 10*3/uL (ref 0.00–0.07)
Basophils Absolute: 0 10*3/uL (ref 0.0–0.1)
Basophils Relative: 0 %
Eosinophils Absolute: 0.1 10*3/uL (ref 0.0–0.5)
Eosinophils Relative: 0 %
HCT: 45.1 % (ref 39.0–52.0)
Hemoglobin: 14.7 g/dL (ref 13.0–17.0)
Immature Granulocytes: 0 %
Lymphocytes Relative: 15 %
Lymphs Abs: 1.8 10*3/uL (ref 0.7–4.0)
MCH: 29.3 pg (ref 26.0–34.0)
MCHC: 32.6 g/dL (ref 30.0–36.0)
MCV: 90 fL (ref 80.0–100.0)
Monocytes Absolute: 0.8 10*3/uL (ref 0.1–1.0)
Monocytes Relative: 7 %
Neutro Abs: 9 10*3/uL — ABNORMAL HIGH (ref 1.7–7.7)
Neutrophils Relative %: 78 %
Platelets: 365 10*3/uL (ref 150–400)
RBC: 5.01 MIL/uL (ref 4.22–5.81)
RDW: 13.9 % (ref 11.5–15.5)
WBC: 11.7 10*3/uL — ABNORMAL HIGH (ref 4.0–10.5)
nRBC: 0 % (ref 0.0–0.2)

## 2020-04-07 LAB — SEDIMENTATION RATE: Sed Rate: 28 mm/hr — ABNORMAL HIGH (ref 0–16)

## 2020-04-07 MED ORDER — VANCOMYCIN HCL IN DEXTROSE 1-5 GM/200ML-% IV SOLN
1000.0000 mg | Freq: Once | INTRAVENOUS | Status: AC
  Administered 2020-04-07: 1000 mg via INTRAVENOUS
  Filled 2020-04-07: qty 200

## 2020-04-07 MED ORDER — SODIUM CHLORIDE 0.9 % IV SOLN
1.0000 g | Freq: Once | INTRAVENOUS | Status: AC
  Administered 2020-04-07: 1 g via INTRAVENOUS
  Filled 2020-04-07: qty 10

## 2020-04-07 NOTE — Consult Note (Signed)
Patient called the office this morning stated that his redness has gotten worse and its past the marked area. I notified the patient that he needs to go to hospital. I have seen the patient at the ER. Patient denies any nausea, vomiting, fever or chills. He has been taking the clindamycin. He does complaint that the toe is numb and moving at the joint. He also states that he has been wrapping the toe like I instructed to him. Patient had surgery done by me on 02/18/20. Patient was doing fine and recovering fine until last week when he came in for his post op appointment. The toe was swollen and red.   Past Medical History:  Diagnosis Date  . Bipolar affect, depressed ([place])   . Diabetes mellitus without complication ([place])   . GERD (gastroesophageal reflux disease)   . Hypertension   . Hypothyroidism         Patient Active Problem List   Diagnosis Date Noted  . Severe recurrent major depression without psychotic features [facility])          Past Surgical History:  Procedure Laterality Date  . AMPUTATION Right 12/24/2019   Procedure: AMPUTATION TOE RIGHT THIRD;  Surgeon: Tyson Babinski, DPM;  Location: [facility];  Service: Podiatry;  Laterality: Right;  WITH LOCAL-pt notified new arrival time of 11:30am per KF  . ANKLE FRACTURE SURGERY    . CLOSED REDUCTION HAND FRACTURE    . DUPUYTREN / PALMAR FASCIOTOMY Left   . ELBOW ARTHROPLASTY Left   . FOOT ARTHRODESIS Right 02/18/2020   Procedure: ARTHRODESIS FOURTH TOE OF RIGHT FOOT;  Surgeon: Tyson Babinski, DPM;  Location: [facility];  Service: Podiatry;  Laterality: Right;  . TONSILLECTOMY                        Family History  Problem Relation Age of Onset  . Cancer Mother   . COPD Father     Social History       Tobacco Use  . Smoking status: Never Smoker  . Smokeless tobacco: Never Used  Vaping Use  . Vaping Use: Never used  Substance Use Topics  . Alcohol use: No  . Drug use: No    Home  Medications        Prior to Admission medications   Medication Sig Start Date End Date Taking? Authorizing Provider  ALPRAZolam Duanne Moron) 1 MG tablet Take 2 mg by mouth at bedtime.     [provider]  amphetamine-dextroamphetamine (ADDERALL XR) 30 MG 24 hr capsule Take 30 mg by mouth every morning.    [provider]  atenolol (TENORMIN) 25 MG tablet Take 25 mg by mouth daily.    [provider]  chlorhexidine (PERIDEX) 0.12 % solution Use as directed 15 mLs in the mouth or throat at bedtime.    [provider]  Cholecalciferol (VITAMIN D) 125 MCG (5000 UT) CAPS Take 5,000 Units by mouth daily.    [provider]  Cyanocobalamin (B-12) 5000 MCG CAPS Take 5,000 mcg by mouth daily.    [provider]  cyclobenzaprine (FLEXERIL) 10 MG tablet Take 20 mg by mouth at bedtime.    [provider]  EPINEPHrine 0.3 mg/0.3 mL IJ SOAJ injection Inject 0.3 mg into the muscle as needed for anaphylaxis. 10/20/19   [provider]  FLUoxetine (PROZAC) 40 MG capsule Take 40 mg by mouth daily.    [provider]  folic acid (FOLVITE) 173 MCG  tablet Take 400 mcg by mouth daily.    [provider]  Insulin Glargine (BASAGLAR KWIKPEN) 100 UNIT/ML Inject 15 Units into the skin daily.    [provider]  L-METHYLFOLATE PO Take 15 mg by mouth daily.    [provider]  lamoTRIgine (LAMICTAL) 25 MG tablet Take 100 mg by mouth at bedtime.    [provider]  levothyroxine (SYNTHROID) 50 MCG tablet Take 50 mcg by mouth daily before breakfast.    [provider]  losartan (COZAAR) 50 MG tablet Take 50 mg by mouth daily.    [provider]  melatonin 5 MG TABS Take 10 mg by mouth at bedtime.    [provider]  metFORMIN (GLUCOPHAGE) 850 MG tablet Take 850 mg by mouth 2 (two) times daily with a meal.     [provider]   montelukast (SINGULAIR) 10 MG tablet Take 10 mg by mouth at bedtime.    [provider]  Multiple Vitamins-Minerals (MULTIVITAMIN WITH MINERALS) tablet Take 1 tablet by mouth daily.    [provider]  NIFEdipine (PROCARDIA-XL/NIFEDICAL-XL) 30 MG 24 hr tablet Take 30 mg by mouth daily. 12/29/19   [provider]  OLANZapine (ZYPREXA) 5 MG tablet Take 5 mg by mouth at bedtime.    [provider]  oxyCODONE-acetaminophen (PERCOCET/ROXICET) 5-325 MG tablet Take 1 tablet by mouth at bedtime as needed for pain. 02/12/20   [provider]  pantoprazole (PROTONIX) 40 MG tablet Take 40 mg by mouth daily.    [provider]  rosuvastatin (CRESTOR) 10 MG tablet Take 10 mg by mouth daily.    [provider]  sitaGLIPtin (JANUVIA) 100 MG tablet Take 100 mg by mouth daily.    [provider]  sodium chloride (OCEAN) 0.65 % SOLN nasal spray Place 1 spray into both nostrils as needed for congestion.    [provider]  temazepam (RESTORIL) 30 MG capsule Take 30 mg by mouth at bedtime.    [provider]  tiZANidine (ZANAFLEX) 4 MG tablet Take 4 mg by mouth 3 (three) times daily.    [provider]  vitamin C (ASCORBIC ACID) 250 MG tablet Take 250 mg by mouth daily.    [provider]    Allergies            Bee venom  Review of Systems   Review of Systems  Constitutional: Negative for chills and fever.  Respiratory: Negative for shortness of breath.   Cardiovascular: Negative for chest pain.  Gastrointestinal: Negative for abdominal pain, nausea and vomiting.  Musculoskeletal: Negative for arthralgias (Right fourth toe pain, redness, and swelling), back pain and joint swelling.  Skin: Positive for color change and wound.       Laceration   Neurological: Negative for dizziness, weakness, numbness and headaches.  Hematological: Does not bruise/bleed easily.     Physical Exam Updated Vital Signs BP (!) 153/71 (BP Location: Right Arm)   Pulse 82   Temp 99.3 F (37.4 C) (Oral)   Resp 18   Ht 6' 2" (1.88 m)   Wt 90.7 kg   SpO2 99%   BMI 25.68 kg/m    Physical Exam Vitals and nursing note reviewed.  Constitutional:      General: He is not in acute distress.    Appearance: Normal appearance.  Vascular: DP and PT pulses palpable. Capillary refill time is brisk to all lesser toe. Right fourth toe is edematous.  Dermatology: The right  fourth toe is swollen, red. The redness has improved compared to last visit. There is still some fluctuance felt on the dorsal aspect of the fourth toe with skin sloughing off.  Musculoskeletol: Partial amputation of the right third toe noted.  Neurology: Numbness to the right fourth toe.    Labs: WBC: 11.7  ESR: 28  EXAM: RIGHT FOOT COMPLETE - 3+ VIEW  COMPARISON:  02/18/2020  FINDINGS: Interval removal of percutaneous pin fixation hardware within the fourth toe related to prior arthrodesis. Persistent lucency across the arthrodesis site with mild adjacent callus formation. There is some focal osteopenia within the distal aspect of the fourth toe proximal phalanx adjacent to the arthrodesis site. Patient is status post third toe amputation at the PIP joint. There is no definite cortical destruction or periostitis. No fracture or dislocation. There is soft tissue swelling of the fourth toe. No soft tissue gas. Partially visualized distal fibular fixation hardware.  IMPRESSION: Interval removal of percutaneous pin fixation hardware within the fourth toe. Persistent lucency across the arthrodesis site with mild adjacent callus formation. There is some focal osteopenia at the distal aspect of the fourth toe proximal phalanx which could represent postsurgical changes. Changes related to early acute osteomyelitis would be difficult to exclude.    EXAM: MRI OF THE RIGHT FOREFOOT WITHOUT  CONTRAST  TECHNIQUE: Multiplanar, multisequence MR imaging of the right forefoot was performed. No intravenous contrast was administered.  COMPARISON:  X-ray 04/07/2020  FINDINGS: Bones/Joint/Cartilage  Postsurgical changes related to fourth digit arthrodesis status post hardware removal. There is extensive bone marrow edema throughout the proximal and distal phalanx of the fourth toe with associated confluent low T1 marrow signal changes compatible with acute osteomyelitis (series 6, images 5-8). Signal abnormality is present within the hardware tract. The plantar aspect of the base of the fourth proximal phalanx has preserved T1 marrow signal, although demonstrates bone marrow edema.  Patient is status post amputation of the third toe at the level of the PIP joint. There is preserved marrow edema within the third toe proximal phalanx. The remaining osseous structures of the forefoot are intact without fracture, dislocation, or evidence of osteomyelitis. No joint effusions.  Ligaments  Intact Lisfranc ligament. Collateral ligaments of the forefoot are intact.  Muscles and Tendons  Mild atrophy and fatty infiltration of the intrinsic foot musculature as well as diffuse intramuscular edema-like signal. Findings likely represent a combination of denervation changes and myositis. Intact flexor and extensor tendons without focal tenosynovial fluid collection.  Soft tissues  Prominent soft tissue swelling of the fourth toe with soft tissue edema. Foci of susceptibility within the soft tissues dorsal to the fourth toe arthrodesis site, which could reflect soft tissue gas or micrometallic foci related to prior surgery. There is mild subcutaneous edema over the dorsum of the forefoot. There is no organized soft tissue fluid collection.  IMPRESSION: 1. 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 2. Diffuse soft tissue swelling of the fourth toe with small foci of susceptibility within the soft tissues dorsal to the arthrodesis site, which could reflect soft tissue gas or micrometallic foci related to prior surgery. 3. Mild atrophy and fatty infiltration of the intrinsic foot musculature as well as diffuse intramuscular edema-like signal. Findings likely represent a combination of denervation changes and myositis.   Assessment/Plan:   Right fourth toe cellulitis and infection. Right fourth toe questionable osteomyelitis vs post surgical changes   and non union.   Plan: Patient examined and evaluated. I had lengthy discussion with patient about the radiograph finding, MRI finding, labs. I explained him that its hard to distinguish whether this is bone infection or not. I recommend we treat it with antibiotics for few days and see how he responds to it. He is aware that the toe might have to be amputated if it does not heal. I also discussed bone biopsy in future.  I recommend IV antibiotics. We can have patient set up for IV antibiotics as outpatient.  I recommend he wears the surgical shoe and limit his walking and standing. I recommend he continues wrapping his toe to help reduce swelling. Patient will follow up with me tomorrow at office.  Patient can be discharged because I feel that the redness has gone down and we will continue to monitor it as outpatient.  All his questions were answered. He is happy and feels comfortable that the redness has gone down. He will follow up with me in office.    

## 2020-04-07 NOTE — Discharge Instructions (Addendum)
The MRI of your foot shows some likely osteomyelitis of the right fourth toe.  Continue taking your antibiotic as directed.  Be sure to follow-up with Dr. Allena Katz tomorrow.

## 2020-04-07 NOTE — ED Provider Notes (Signed)
[facility]Y [facility] Provider Note   CSN: [id number] Arrival date & time: 04/07/20  1254     History Chief Complaint  Patient presents with  . Abnormal Lab    Theodore Long is a 64 y.o. male.  HPI     Theodore Long is a 64 y.o. male with past medical history of diabetes, hypertension, and hypothyroidism who presents to the Emergency Department complaining of pain and and redness of the right fourth toe.  He states that he is currently being treated by Dr. Allena KatzPatel with podiatry.  He had a partial amputation of the third toe in June and now has numbness, redness and swelling of the fourth toe. He states the toe feels "loose."  He is currently taking clindamycin.  He states that he was advised by his podiatrist to come to the emergency room for evaluation and possible IV antibiotics.  He denies fever, chills, pain radiating proximal from the toe or red streaking.    Past Medical History:  Diagnosis Date  . Bipolar affect, depressed (HCC)   . Diabetes mellitus without complication (HCC)   . GERD (gastroesophageal reflux disease)   . Hypertension   . Hypothyroidism     Patient Active Problem List   Diagnosis Date Noted  . Severe recurrent major depression without psychotic features [facility](HCC)     Past Surgical History:  Procedure Laterality Date  . AMPUTATION Right 12/24/2019   Procedure: AMPUTATION TOE RIGHT THIRD;  Surgeon: Erskine EmeryPatel, Prayashkumar, DPM;  Location: [facility];  Service: Podiatry;  Laterality: Right;  WITH LOCAL-pt notified new arrival time of 11:30am per KF  . ANKLE FRACTURE SURGERY    . CLOSED REDUCTION HAND FRACTURE    . DUPUYTREN / PALMAR FASCIOTOMY Left   . ELBOW ARTHROPLASTY Left   . FOOT ARTHRODESIS Right 02/18/2020   Procedure: ARTHRODESIS FOURTH TOE OF RIGHT FOOT;  Surgeon: Erskine EmeryPatel, Prayashkumar, DPM;  Location: [facility];  Service: Podiatry;  Laterality: Right;  . TONSILLECTOMY         Family History  Problem Relation Age of Onset  . Cancer  Mother   . COPD Father     Social History   Tobacco Use  . Smoking status: Never Smoker  . Smokeless tobacco: Never Used  Vaping Use  . Vaping Use: Never used  Substance Use Topics  . Alcohol use: No  . Drug use: No    Home Medications Prior to Admission medications   Medication Sig Start Date End Date Taking? Authorizing Provider  ALPRAZolam Prudy Feeler(XANAX) 1 MG tablet Take 2 mg by mouth at bedtime.     [provider]  amphetamine-dextroamphetamine (ADDERALL XR) 30 MG 24 hr capsule Take 30 mg by mouth every morning.    [provider]  atenolol (TENORMIN) 25 MG tablet Take 25 mg by mouth daily.    [provider]  chlorhexidine (PERIDEX) 0.12 % solution Use as directed 15 mLs in the mouth or throat at bedtime.    [provider]  Cholecalciferol (VITAMIN D) 125 MCG (5000 UT) CAPS Take 5,000 Units by mouth daily.    [provider]  Cyanocobalamin (B-12) 5000 MCG CAPS Take 5,000 mcg by mouth daily.    [provider]  cyclobenzaprine (FLEXERIL) 10 MG tablet Take 20 mg by mouth at bedtime.    [provider]  EPINEPHrine 0.3 mg/0.3 mL IJ SOAJ injection Inject 0.3 mg into the muscle as needed for anaphylaxis. 10/20/19   [provider]  FLUoxetine (PROZAC)  40 MG capsule Take 40 mg by mouth daily.    [provider]  folic acid (FOLVITE) 400 MCG tablet Take 400 mcg by mouth daily.    [provider]  Insulin Glargine (BASAGLAR KWIKPEN) 100 UNIT/ML Inject 15 Units into the skin daily.    [provider]  L-METHYLFOLATE PO Take 15 mg by mouth daily.    [provider]  lamoTRIgine (LAMICTAL) 25 MG tablet Take 100 mg by mouth at bedtime.    [provider]  levothyroxine (SYNTHROID) 50 MCG tablet Take 50 mcg by mouth daily before breakfast.    [provider]  losartan (COZAAR) 50 MG tablet Take 50 mg by mouth daily.    [provider]  melatonin 5 MG TABS Take 10  mg by mouth at bedtime.    [provider]  metFORMIN (GLUCOPHAGE) 850 MG tablet Take 850 mg by mouth 2 (two) times daily with a meal.     [provider]  montelukast (SINGULAIR) 10 MG tablet Take 10 mg by mouth at bedtime.    [provider]  Multiple Vitamins-Minerals (MULTIVITAMIN WITH MINERALS) tablet Take 1 tablet by mouth daily.    [provider]  NIFEdipine (PROCARDIA-XL/NIFEDICAL-XL) 30 MG 24 hr tablet Take 30 mg by mouth daily. 12/29/19   [provider]  OLANZapine (ZYPREXA) 5 MG tablet Take 5 mg by mouth at bedtime.    [provider]  oxyCODONE-acetaminophen (PERCOCET/ROXICET) 5-325 MG tablet Take 1 tablet by mouth at bedtime as needed for pain. 02/12/20   [provider]  pantoprazole (PROTONIX) 40 MG tablet Take 40 mg by mouth daily.    [provider]  rosuvastatin (CRESTOR) 10 MG tablet Take 10 mg by mouth daily.    [provider]  sitaGLIPtin (JANUVIA) 100 MG tablet Take 100 mg by mouth daily.    [provider]  sodium chloride (OCEAN) 0.65 % SOLN nasal spray Place 1 spray into both nostrils as needed for congestion.    [provider]  temazepam (RESTORIL) 30 MG capsule Take 30 mg by mouth at bedtime.    [provider]  tiZANidine (ZANAFLEX) 4 MG tablet Take 4 mg by mouth 3 (three) times daily.    [provider]  vitamin C (ASCORBIC ACID) 250 MG tablet Take 250 mg by mouth daily.    [provider]    Allergies    Bee venom  Review of Systems   Review of Systems  Constitutional: Negative for chills and fever.  Respiratory: Negative for shortness of breath.   Cardiovascular: Negative for chest pain.  Gastrointestinal: Negative for abdominal pain, nausea and vomiting.  Musculoskeletal: Negative for arthralgias (Right fourth toe pain, redness, and swelling), back pain and joint swelling.  Skin: Positive for color change and wound.       Laceration    Neurological: Negative for dizziness, weakness, numbness and headaches.  Hematological: Does not bruise/bleed easily.    Physical Exam Updated Vital Signs BP (!) 153/71 (BP Location: Right Arm)   Pulse 82   Temp 99.3 F (37.4 C) (Oral)   Resp 18   Ht 6\' 2"  (1.88 m)   Wt 90.7 kg   SpO2 99%   BMI 25.68 kg/m   Physical Exam Vitals and nursing note reviewed.  Constitutional:      General: He is not in acute distress.    Appearance: Normal appearance.  Cardiovascular:     Rate and Rhythm: Normal rate and regular  rhythm.     Pulses: Normal pulses.  Pulmonary:     Effort: Pulmonary effort is normal.     Breath sounds: Normal breath sounds.  Musculoskeletal:        General: Swelling and tenderness present.     Comments: Redness, edema, and open wound of the right fourth toe.  Erythema does not extend beyond the distal foot.  No lymphangitis.  No drainage.  Skin:    Capillary Refill: Capillary refill takes less than 2 seconds.  Neurological:     General: No focal deficit present.     Mental Status: He is alert.     Sensory: No sensory deficit.     Motor: No weakness.     ED Results / Procedures / Treatments   Labs (all labs ordered are listed, but only abnormal results are displayed) Labs Reviewed  CBC WITH DIFFERENTIAL/PLATELET - Abnormal; Notable for the following components:      Result Value   WBC 11.7 (*)    Neutro Abs 9.0 (*)    All other components within normal limits  BASIC METABOLIC PANEL - Abnormal; Notable for the following components:   Chloride 95 (*)    All other components within normal limits  SEDIMENTATION RATE - Abnormal; Notable for the following components:   Sed Rate 28 (*)    All other components within normal limits    EKG None  Radiology MR FOOT RIGHT WO CONTRAST  Result Date: 04/07/2020 CLINICAL DATA:  Concern for infection of the right fourth toe EXAM: MRI OF THE RIGHT FOREFOOT WITHOUT CONTRAST TECHNIQUE: Multiplanar, multisequence  MR imaging of the right forefoot was performed. No intravenous contrast was administered. COMPARISON:  X-ray 04/07/2020 FINDINGS: Bones/Joint/Cartilage Postsurgical changes related to fourth digit arthrodesis status post hardware removal. There is extensive bone marrow edema throughout the proximal and distal phalanx of the fourth toe with associated confluent low T1 marrow signal changes compatible with acute osteomyelitis (series 6, images 5-8). Signal abnormality is present within the hardware tract. The plantar aspect of the base of the fourth proximal phalanx has preserved T1 marrow signal, although demonstrates bone marrow edema. Patient is status post amputation of the third toe at the level of the PIP joint. There is preserved marrow edema within the third toe proximal phalanx. The remaining osseous structures of the forefoot are intact without fracture, dislocation, or evidence of osteomyelitis. No joint effusions. Ligaments Intact Lisfranc ligament. Collateral ligaments of the forefoot are intact. Muscles and Tendons Mild atrophy and fatty infiltration of the intrinsic foot musculature as well as diffuse intramuscular edema-like signal. Findings likely represent a combination of denervation changes and myositis. Intact flexor and extensor tendons without focal tenosynovial fluid collection. Soft tissues Prominent soft tissue swelling of the fourth toe with soft tissue edema. Foci of susceptibility within the soft tissues dorsal to the fourth toe arthrodesis site, which could reflect soft tissue gas or micrometallic foci related to prior surgery. There is mild subcutaneous edema over the dorsum of the forefoot. There is no organized soft tissue fluid collection. IMPRESSION: 1. Findings compatible with acute osteomyelitis of the right fourth toe with signal abnormality extending to the base of the fourth toe proximal phalanx. 2. Diffuse soft tissue swelling of the fourth toe with small foci of susceptibility  within the soft tissues dorsal to the arthrodesis site, which could reflect soft tissue gas or micrometallic foci related to prior surgery. 3. Mild atrophy and fatty infiltration of the intrinsic foot musculature as well as diffuse  intramuscular edema-like signal. Findings likely represent a combination of denervation changes and myositis. Electronically Signed   By: Duanne Guess D.O.   On: 04/07/2020 16:08   DG Foot Complete Right  Result Date: 04/07/2020 CLINICAL DATA:  Redness of the toes of the right foot EXAM: RIGHT FOOT COMPLETE - 3+ VIEW COMPARISON:  02/18/2020 FINDINGS: Interval removal of percutaneous pin fixation hardware within the fourth toe related to prior arthrodesis. Persistent lucency across the arthrodesis site with mild adjacent callus formation. There is some focal osteopenia within the distal aspect of the fourth toe proximal phalanx adjacent to the arthrodesis site. Patient is status post third toe amputation at the PIP joint. There is no definite cortical destruction or periostitis. No fracture or dislocation. There is soft tissue swelling of the fourth toe. No soft tissue gas. Partially visualized distal fibular fixation hardware. IMPRESSION: Interval removal of percutaneous pin fixation hardware within the fourth toe. Persistent lucency across the arthrodesis site with mild adjacent callus formation. There is some focal osteopenia at the distal aspect of the fourth toe proximal phalanx which could represent postsurgical changes. Changes related to early acute osteomyelitis would be difficult to exclude. Electronically Signed   By: Duanne Guess D.O.   On: 04/07/2020 14:37    Procedures Procedures (including critical care time)  Medications Ordered in ED Medications  vancomycin (VANCOCIN) IVPB 1000 mg/200 mL premix (0 mg Intravenous Stopped 04/07/20 1702)  cefTRIAXone (ROCEPHIN) 1 g in sodium chloride 0.9 % 100 mL IVPB (0 g Intravenous Stopped 04/07/20 1548)    ED  Course  I have reviewed the triage vital signs and the nursing notes.  Pertinent labs & imaging results that were available during my care of the patient were reviewed by me and considered in my medical decision making (see chart for details).   MDM Rules/Calculators/A&P                         Patient with exam findings consistent with acute infection of the right fourth toe, possible osteomyelitis as well.  1420  Dr. Allena Katz with podiatry here to evaluate the patient.  He has reviewed the Xrays and requests MRI of the foot and IV antibiotics.  Patient given IV vancomycin and Rocephin here.  He is currently taking oral clindamycin.  Overall, patient is well-appearing.  Nontoxic.  Labs reviewed by me.  Electrolytes without significant abnormality, No significant leukocytosis.  Sed rate slightly elevated at 28.  I have contacted Dr. Allena Katz to notify him of blood work and MRI results.  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

## 2020-04-07 NOTE — ED Triage Notes (Signed)
Pt to er, pt states that md Allena Katz told him to come into the er to get some abx, states that he had surgery on his toe and MD Allena Katz thought that it was getting infected.

## 2020-04-07 NOTE — ED Notes (Signed)
Pt dicharged, taken to front lobby in wheel chair

## 2020-04-07 NOTE — ED Notes (Signed)
Right foot dresser per provider orders.

## 2020-04-08 ENCOUNTER — Other Ambulatory Visit: Payer: Self-pay | Admitting: Podiatry

## 2020-04-13 ENCOUNTER — Other Ambulatory Visit: Payer: Self-pay

## 2020-04-13 ENCOUNTER — Other Ambulatory Visit (HOSPITAL_COMMUNITY)
Admission: RE | Admit: 2020-04-13 | Discharge: 2020-04-13 | Disposition: A | Discharge status: Home / Self Care | Payer: Medicare Other | Source: Ambulatory Visit | Attending: Podiatry | Admitting: Podiatry

## 2020-04-13 ENCOUNTER — Encounter (HOSPITAL_COMMUNITY): Payer: Self-pay

## 2020-04-13 ENCOUNTER — Encounter (HOSPITAL_COMMUNITY)
Admission: RE | Admit: 2020-04-13 | Discharge: 2020-04-13 | Disposition: A | Discharge status: Home / Self Care | Payer: Medicare Other | Source: Ambulatory Visit | Attending: Podiatry | Admitting: Podiatry

## 2020-04-13 DIAGNOSIS — Z20822 Contact with and (suspected) exposure to covid-19: Secondary | ICD-10-CM | POA: Insufficient documentation

## 2020-04-13 DIAGNOSIS — Z01812 Encounter for preprocedural laboratory examination: Secondary | ICD-10-CM | POA: Insufficient documentation

## 2020-04-13 LAB — SARS CORONAVIRUS 2 (TAT 6-24 HRS): SARS Coronavirus 2: NEGATIVE

## 2020-04-13 NOTE — Patient Instructions (Addendum)
    Theodore Long  04/13/2020     @PREFPERIOPPHARMACY @   Your procedure is scheduled on 04/14/2120.  Report to 04/16/2120 at 6:15 A.M.  Call this number if you have problems the morning of surgery:  [phone]   Remember:  Do not eat or drink after midnight.     Take these medicines the morning of surgery with A SIP OF WATER: Adderal, Atenolol, Prozac, Protonix, levothyroxine   Do not take any diabetic medication the am of surgery   Do not wear jewelry, make-up or nail polish.  Do not wear lotions, powders, or perfumes, or deodorant.  Do not shave 48 hours prior to surgery.  Men may shave face and neck.  Do not bring valuables to the hospital.  [facility] is not responsible for any belongings or valuables.  Contacts, dentures or bridgework may not be worn into surgery.  Leave your suitcase in the car.  After surgery it may be brought to your room.  For patients admitted to the hospital, discharge time will be determined by your treatment team.  Patients discharged the day of surgery will not be allowed to drive home.   Name and phone number of your driver:   family Special instructions:  n/a  Please read over the following fact sheets that you were given. Care and Recovery After Surgery

## 2020-04-14 ENCOUNTER — Ambulatory Visit (HOSPITAL_COMMUNITY): Payer: Medicare Other

## 2020-04-14 ENCOUNTER — Encounter (HOSPITAL_COMMUNITY)
Admission: RE | Disposition: A | Discharge status: Home / Self Care | Payer: Self-pay | Source: Home / Self Care | Attending: Podiatry

## 2020-04-14 ENCOUNTER — Ambulatory Visit (HOSPITAL_COMMUNITY): Payer: Medicare Other | Admitting: Certified Registered"

## 2020-04-14 ENCOUNTER — Ambulatory Visit (HOSPITAL_COMMUNITY)
Admission: RE | Admit: 2020-04-14 | Discharge: 2020-04-14 | Disposition: A | Discharge status: Home / Self Care | Payer: Medicare Other | Attending: Podiatry | Admitting: Podiatry

## 2020-04-14 ENCOUNTER — Encounter (HOSPITAL_COMMUNITY): Payer: Self-pay

## 2020-04-14 DIAGNOSIS — F319 Bipolar disorder, unspecified: Secondary | ICD-10-CM | POA: Diagnosis not present

## 2020-04-14 DIAGNOSIS — E1169 Type 2 diabetes mellitus with other specified complication: Secondary | ICD-10-CM | POA: Diagnosis not present

## 2020-04-14 DIAGNOSIS — M868X7 Other osteomyelitis, ankle and foot: Secondary | ICD-10-CM | POA: Diagnosis not present

## 2020-04-14 DIAGNOSIS — Z9889 Other specified postprocedural states: Secondary | ICD-10-CM

## 2020-04-14 DIAGNOSIS — I10 Essential (primary) hypertension: Secondary | ICD-10-CM | POA: Diagnosis not present

## 2020-04-14 HISTORY — PX: AMPUTATION TOE: SHX6595

## 2020-04-14 LAB — GLUCOSE, CAPILLARY
Glucose-Capillary: 89 mg/dL (ref 70–99)
Glucose-Capillary: 90 mg/dL (ref 70–99)

## 2020-04-14 SURGERY — AMPUTATION, TOE
Anesthesia: General | Site: Toe | Laterality: Right

## 2020-04-14 MED ORDER — FENTANYL CITRATE (PF) 100 MCG/2ML IJ SOLN
INTRAMUSCULAR | Status: DC | PRN
  Administered 2020-04-14 (×4): 25 ug via INTRAVENOUS

## 2020-04-14 MED ORDER — FENTANYL CITRATE (PF) 100 MCG/2ML IJ SOLN: 25.0000 ug | INTRAMUSCULAR | Status: DC | PRN

## 2020-04-14 MED ORDER — MIDAZOLAM HCL 5 MG/5ML IJ SOLN
INTRAMUSCULAR | Status: DC | PRN
  Administered 2020-04-14: 2 mg via INTRAVENOUS

## 2020-04-14 MED ORDER — CLINDAMYCIN PHOSPHATE 900 MG/50ML IV SOLN
900.0000 mg | Freq: Once | INTRAVENOUS | Status: AC
  Administered 2020-04-14: 900 mg via INTRAVENOUS
  Filled 2020-04-14: qty 50

## 2020-04-14 MED ORDER — FENTANYL CITRATE (PF) 100 MCG/2ML IJ SOLN
INTRAMUSCULAR | Status: AC
  Filled 2020-04-14: qty 2

## 2020-04-14 MED ORDER — LIDOCAINE 2% (20 MG/ML) 5 ML SYRINGE
INTRAMUSCULAR | Status: AC
  Filled 2020-04-14: qty 5

## 2020-04-14 MED ORDER — MIDAZOLAM HCL 2 MG/2ML IJ SOLN
INTRAMUSCULAR | Status: AC
  Filled 2020-04-14: qty 2

## 2020-04-14 MED ORDER — DEXAMETHASONE SODIUM PHOSPHATE 10 MG/ML IJ SOLN
INTRAMUSCULAR | Status: AC
  Filled 2020-04-14: qty 1

## 2020-04-14 MED ORDER — ONDANSETRON HCL 4 MG/2ML IJ SOLN: 4.0000 mg | Freq: Once | INTRAMUSCULAR | Status: DC | PRN

## 2020-04-14 MED ORDER — LIDOCAINE HCL (PF) 1 % IJ SOLN
INTRAMUSCULAR | Status: AC
  Filled 2020-04-14: qty 30

## 2020-04-14 MED ORDER — BUPIVACAINE HCL (PF) 0.5 % IJ SOLN
INTRAMUSCULAR | Status: AC
  Filled 2020-04-14: qty 30

## 2020-04-14 MED ORDER — ORAL CARE MOUTH RINSE: 15.0000 mL | Freq: Once | OROMUCOSAL | Status: AC

## 2020-04-14 MED ORDER — LACTATED RINGERS IV SOLN
INTRAVENOUS | Status: DC
  Administered 2020-04-14: 1000 mL via INTRAVENOUS

## 2020-04-14 MED ORDER — CHLORHEXIDINE GLUCONATE 0.12 % MT SOLN
15.0000 mL | Freq: Once | OROMUCOSAL | Status: AC
  Administered 2020-04-14: 15 mL via OROMUCOSAL

## 2020-04-14 MED ORDER — CLINDAMYCIN PHOSPHATE 600 MG/50ML IV SOLN: 600.0000 mg | Freq: Once | INTRAVENOUS | Status: DC

## 2020-04-14 MED ORDER — ONDANSETRON HCL 4 MG/2ML IJ SOLN
INTRAMUSCULAR | Status: DC | PRN
  Administered 2020-04-14: 4 mg via INTRAVENOUS

## 2020-04-14 MED ORDER — LIDOCAINE HCL 1 % IJ SOLN
INTRAMUSCULAR | Status: DC | PRN
  Administered 2020-04-14: 10 mL via INTRAMUSCULAR

## 2020-04-14 MED ORDER — DEXAMETHASONE SODIUM PHOSPHATE 4 MG/ML IJ SOLN
INTRAMUSCULAR | Status: DC | PRN
  Administered 2020-04-14: 8 mg via INTRAVENOUS

## 2020-04-14 MED ORDER — LIDOCAINE HCL (CARDIAC) PF 100 MG/5ML IV SOSY
PREFILLED_SYRINGE | INTRAVENOUS | Status: DC | PRN
  Administered 2020-04-14: 100 mg via INTRAVENOUS

## 2020-04-14 MED ORDER — ONDANSETRON HCL 4 MG/2ML IJ SOLN
INTRAMUSCULAR | Status: AC
  Filled 2020-04-14: qty 2

## 2020-04-14 MED ORDER — 0.9 % SODIUM CHLORIDE (POUR BTL) OPTIME
TOPICAL | Status: DC | PRN
  Administered 2020-04-14: 1000 mL

## 2020-04-14 MED ORDER — PROPOFOL 10 MG/ML IV BOLUS
INTRAVENOUS | Status: DC | PRN
  Administered 2020-04-14: 100 ug/kg/min via INTRAVENOUS
  Administered 2020-04-14: 80 mg via INTRAVENOUS

## 2020-04-14 MED ORDER — PROPOFOL 10 MG/ML IV BOLUS
INTRAVENOUS | Status: AC
  Filled 2020-04-14: qty 40

## 2020-04-14 SURGICAL SUPPLY — 49 items
APL SKNCLS STERI-STRIP NONHPOA (GAUZE/BANDAGES/DRESSINGS) ×1
BANDAGE ESMARK 4X12 BL STRL LF (DISPOSABLE) ×1 IMPLANT
BENZOIN TINCTURE PRP APPL 2/3 (GAUZE/BANDAGES/DRESSINGS) ×3 IMPLANT
BLADE AVERAGE 25MMX9MM (BLADE) ×1
BLADE AVERAGE 25X9 (BLADE) ×2 IMPLANT
BLADE SURG 15 STRL LF DISP TIS (BLADE) ×1 IMPLANT
BLADE SURG 15 STRL SS (BLADE) ×3
BNDG CMPR 12X4 ELC STRL LF (DISPOSABLE) ×1
BNDG CMPR STD VLCR NS LF 5.8X4 (GAUZE/BANDAGES/DRESSINGS) ×1
BNDG CONFORM 2 STRL LF (GAUZE/BANDAGES/DRESSINGS) ×3 IMPLANT
BNDG ELASTIC 4X5.8 VLCR NS LF (GAUZE/BANDAGES/DRESSINGS) ×3 IMPLANT
BNDG ESMARK 4X12 BLUE STRL LF (DISPOSABLE) ×3
BNDG GAUZE ELAST 4 BULKY (GAUZE/BANDAGES/DRESSINGS) ×3 IMPLANT
CLOSURE WOUND 1/2 X4 (GAUZE/BANDAGES/DRESSINGS) ×1
CLOTH BEACON ORANGE TIMEOUT ST (SAFETY) ×3 IMPLANT
COVER LIGHT HANDLE STERIS (MISCELLANEOUS) ×3 IMPLANT
COVER WAND RF STERILE (DRAPES) ×3 IMPLANT
CUFF TOURN SGL QUICK 18X4 (TOURNIQUET CUFF) ×3 IMPLANT
DECANTER SPIKE VIAL GLASS SM (MISCELLANEOUS) ×6 IMPLANT
DRSG ADAPTIC 3X8 NADH LF (GAUZE/BANDAGES/DRESSINGS) ×3 IMPLANT
ELECT REM PT RETURN 9FT ADLT (ELECTROSURGICAL) ×3
ELECTRODE REM PT RTRN 9FT ADLT (ELECTROSURGICAL) ×1 IMPLANT
GAUZE SPONGE 4X4 12PLY STRL (GAUZE/BANDAGES/DRESSINGS) ×3 IMPLANT
GLOVE BIO SURGEON STRL SZ7.5 (GLOVE) IMPLANT
GLOVE BIOGEL PI IND STRL 6.5 (GLOVE) ×1 IMPLANT
GLOVE BIOGEL PI IND STRL 7.0 (GLOVE) ×2 IMPLANT
GLOVE BIOGEL PI IND STRL 7.5 (GLOVE) ×1 IMPLANT
GLOVE BIOGEL PI INDICATOR 6.5 (GLOVE) ×2
GLOVE BIOGEL PI INDICATOR 7.0 (GLOVE) ×4
GLOVE BIOGEL PI INDICATOR 7.5 (GLOVE) ×2
GLOVE ECLIPSE 7.0 STRL STRAW (GLOVE) ×6 IMPLANT
GOWN STRL REUS W/ TWL XL LVL3 (GOWN DISPOSABLE) ×1 IMPLANT
GOWN STRL REUS W/TWL LRG LVL3 (GOWN DISPOSABLE) ×3 IMPLANT
GOWN STRL REUS W/TWL XL LVL3 (GOWN DISPOSABLE) ×3
KIT TURNOVER KIT A (KITS) ×3 IMPLANT
MANIFOLD NEPTUNE II (INSTRUMENTS) ×3 IMPLANT
NEEDLE HYPO 25X1 1.5 SAFETY (NEEDLE) ×6 IMPLANT
NEEDLE HYPO 27GX1-1/4 (NEEDLE) ×6 IMPLANT
NS IRRIG 1000ML POUR BTL (IV SOLUTION) ×3 IMPLANT
PACK BASIC LIMB (CUSTOM PROCEDURE TRAY) ×3 IMPLANT
PAD ARMBOARD 7.5X6 YLW CONV (MISCELLANEOUS) ×3 IMPLANT
RASP SM TEAR CROSS CUT (RASP) IMPLANT
SET BASIN LINEN APH (SET/KITS/TRAYS/PACK) ×3 IMPLANT
STRIP CLOSURE SKIN 1/2X4 (GAUZE/BANDAGES/DRESSINGS) ×2 IMPLANT
SUT ETHILON 3 0 FSL (SUTURE) ×3 IMPLANT
SUT PROLENE 3 0 PS 1 (SUTURE) IMPLANT
SUT VIC AB 3-0 SH 27 (SUTURE)
SUT VIC AB 3-0 SH 27X BRD (SUTURE) IMPLANT
SYR CONTROL 10ML LL (SYRINGE) ×6 IMPLANT

## 2020-04-14 NOTE — Op Note (Signed)
PATIENT:  Theodore Long  64 y.o. male  PRE-OPERATIVE DIAGNOSIS:  OSTEOMYOLITIS FOURTH TOE RIGHT FOOT  POST-OPERATIVE DIAGNOSIS:  OSTEOMYOLITIS FOURTH TOE RIGHT FOOT  PROCEDURE:  Procedure(s): AMPUTATION FOURTH TOE RIGHT FOOT (Right)  SURGEON:  Surgeon(s) and Role:    * Janal Haak, Layla Barter, DPM - Primary  ASSISTANTS: None  ANESTHESIA: Local with MAC  EBL: none  LOCAL MEDICATIONS USED:  MARCAINE   , LIDOCAINE  and Amount: 10 ml  SPECIMEN:  Source of Specimen:  right fourth toe.   DISPOSITION OF SPECIMEN:  PATHOLOGY  Material used: 4-0 Nylon.   TOURNIQUET:   Total Tourniquet Time Documented: Calf (Right) - 35 minutes Total: Calf (Right) - 35 minutes  Patient was brought into the operating room laid supine on the operating table. Ankle tourniquet was applied to the surgical extremity. Following IV sedation, a local block was achieved using 10 cc of mixture of 1% plain lidocaine with 0.5% marcaine. The foot was the prepped, scrubbed and draped in aseptic manner. Using an esmarch band the tourniquet on the surgical site was inflatted at 252mHG.   Attention was directed towards the right fourth toe. A fish mouth incision was planned to remove the toe. Using a #15 blade, incision was made down to subcutaneous tissue. The right fourth toe was disarticulated at the MPJ and removed. The base of the proximal phalanx was send as clean margin. Remaining toe was sent to pathology. Deep wound cultures obtained. Wound was irrigated with copious amount of normal saline. The skin was closed using 4-0 nylon. Excess skin was removed to facilitate the closure. Dry sterile dressing applied. Tourniquet was deflated. Capillary refill time was brisk to all lesser digits.    Patient has post op appointment scheduled to see me.

## 2020-04-14 NOTE — Anesthesia Postprocedure Evaluation (Signed)
Anesthesia Post Note  Patient: Theodore Long  Procedure(s) Performed: AMPUTATION FOURTH TOE RIGHT FOOT (Right Toe)  Patient location during evaluation: PACU Anesthesia Type: General Level of consciousness: awake, oriented, awake and alert and patient cooperative Pain management: pain level controlled Vital Signs Assessment: post-procedure vital signs reviewed and stable Respiratory status: spontaneous breathing, respiratory function stable and nonlabored ventilation Cardiovascular status: blood pressure returned to baseline and stable Postop Assessment: no headache and no backache Anesthetic complications: no   No complications documented.   Last Vitals:  Vitals:   04/14/20 0642  BP: (!) 171/78  Resp: 18  Temp: 36.8 C  SpO2: 100%    Last Pain:  Vitals:   04/14/20 0642  TempSrc: Oral  PainSc: 0-No pain                 Brynda Peon

## 2020-04-14 NOTE — Anesthesia Preprocedure Evaluation (Signed)
Anesthesia Evaluation  Patient identified by MRN, date of birth, ID band Patient awake    Reviewed: Allergy & Precautions, H&P , NPO status , Patient's Chart, lab work & pertinent test results, reviewed documented beta blocker date and time   Airway Mallampati: II  TM Distance: >3 FB Neck ROM: full    Dental no notable dental hx. (+) Teeth Intact   Pulmonary neg pulmonary ROS,    Pulmonary exam normal breath sounds clear to auscultation       Cardiovascular Exercise Tolerance: Good hypertension, negative cardio ROS   Rhythm:regular Rate:Normal     Neuro/Psych PSYCHIATRIC DISORDERS Depression Bipolar Disorder negative neurological ROS     GI/Hepatic Neg liver ROS, GERD  Medicated,  Endo/Other  diabetesHypothyroidism   Renal/GU negative Renal ROS  negative genitourinary   Musculoskeletal   Abdominal   Peds  Hematology negative hematology ROS (+)   Anesthesia Other Findings   Reproductive/Obstetrics negative OB ROS                             Anesthesia Physical Anesthesia Plan  ASA: II  Anesthesia Plan: General   Post-op Pain Management:    Induction:   PONV Risk Score and Plan: Propofol infusion  Airway Management Planned:   Additional Equipment:   Intra-op Plan:   Post-operative Plan:   Informed Consent: I have reviewed the patients History and Physical, chart, labs and discussed the procedure including the risks, benefits and alternatives for the proposed anesthesia with the patient or authorized representative who has indicated his/her understanding and acceptance.     Dental Advisory Given  Plan Discussed with: CRNA  Anesthesia Plan Comments:         Anesthesia Quick Evaluation

## 2020-04-14 NOTE — Discharge Instructions (Signed)

## 2020-04-14 NOTE — H&P (Signed)
.   HISTORY AND PHYSICAL INTERVAL NOTE:  04/14/2020  7:18 AM  Theodore Long  has presented today for surgery, with the diagnosis of OSTEOMYOLITIS FOURTH TOE RIGHT FOOT.  The various methods of treatment have been discussed with the patient.  No guarantees were given.  After consideration of risks, benefits and other options for treatment, the patient has consented to surgery.  I have reviewed the patients' chart and labs.    Patient Vitals for the past 24 hrs:  BP Temp Temp src Resp SpO2  04/14/20 0642 (!) 171/78 98.3 F (36.8 C) Oral 18 100 %    A history and physical examination was performed in my office.  The patient was reexamined.  There have been no changes to this history and physical examination.  Erskine Emery, DPM

## 2020-04-14 NOTE — Brief Op Note (Signed)
04/14/2020  8:28 AM  PATIENT:  Theodore Long  64 y.o. male  PRE-OPERATIVE DIAGNOSIS:  OSTEOMYOLITIS FOURTH TOE RIGHT FOOT  POST-OPERATIVE DIAGNOSIS:  OSTEOMYOLITIS FOURTH TOE RIGHT FOOT  PROCEDURE:  Procedure(s): AMPUTATION FOURTH TOE RIGHT FOOT (Right)  SURGEON:  Surgeon(s) and Role:    * Kewan Mcnease, Layla Barter, DPM - Primary    ASSISTANTS: None  ANESTHESIA: Local with MAC  EBL: none  BLOOD ADMINISTERED:none  DRAINS: none   LOCAL MEDICATIONS USED:  MARCAINE   , LIDOCAINE  and Amount: 10 ml  SPECIMEN:  Source of Specimen:  right fourth toe.   DISPOSITION OF SPECIMEN:  PATHOLOGY  COUNTS:  YES  TOURNIQUET:   Total Tourniquet Time Documented: Calf (Right) - 35 minutes Total: Calf (Right) - 35 minutes   DICTATION: .Viviann Spare Dictation  PLAN OF CARE: Discharge to home after PACU  PATIENT DISPOSITION:  PACU - hemodynamically stable.   Delay start of Pharmacological VTE agent (>24hrs) due to surgical blood loss or risk of bleeding: not applicable

## 2020-04-14 NOTE — Transfer of Care (Signed)
Immediate Anesthesia Transfer of Care Note  Patient: Theodore Long  Procedure(s) Performed: AMPUTATION FOURTH TOE RIGHT FOOT (Right Toe)  Patient Location: PACU  Anesthesia Type:General  Level of Consciousness: awake, alert , oriented and patient cooperative  Airway & Oxygen Therapy: Patient Spontanous Breathing  Post-op Assessment: Report given to RN, Post -op Vital signs reviewed and stable and Patient moving all extremities  Post vital signs: Reviewed and stable  Last Vitals:  Vitals Value Taken Time  BP 174/75 04/14/20 0822  Temp    Pulse 72 04/14/20 0823  Resp 13 04/14/20 0823  SpO2 95 % 04/14/20 0823  Vitals shown include unvalidated device data.  Last Pain:  Vitals:   04/14/20 0642  TempSrc: Oral  PainSc: 0-No pain      Patients Stated Pain Goal: 8 (04/14/20 3382)  Complications: No complications documented.

## 2020-04-15 ENCOUNTER — Encounter (HOSPITAL_COMMUNITY): Payer: Self-pay | Admitting: Podiatry

## 2020-04-16 LAB — SURGICAL PATHOLOGY

## 2020-04-19 LAB — AEROBIC/ANAEROBIC CULTURE W GRAM STAIN (SURGICAL/DEEP WOUND): Culture: NORMAL

## 2020-05-06 LAB — FUNGUS CULTURE RESULT

## 2020-05-06 LAB — FUNGUS CULTURE WITH STAIN

## 2020-05-06 LAB — FUNGAL ORGANISM REFLEX

## 2021-04-08 ENCOUNTER — Other Ambulatory Visit: Payer: Self-pay | Admitting: Podiatry

## 2021-04-08 DIAGNOSIS — M79674 Pain in right toe(s): Secondary | ICD-10-CM

## 2021-04-15 NOTE — Patient Instructions (Signed)
Your procedure is scheduled on: 04/20/2021  Report to [facility] Short Stay at   8:30  AM.  Call this number if you have problems the morning of surgery: [phone]   Remember:   Do not Eat or Drink after midnight         No Smoking the morning of surgery  :  Take these medicines the morning of surgery with A SIP OF WATER: Atenolol, Klonopin, prozac, levothyroxine, pantoprazole and propranolol  No diabetic medication morning of procedure   Do not wear jewelry, make-up or nail polish.  Do not wear lotions, powders, or perfumes. You may wear deodorant.  Do not shave 48 hours prior to surgery. Men may shave face and neck.  Do not bring valuables to the hospital.  Contacts, dentures or bridgework may not be worn into surgery.  Leave suitcase in the car. After surgery it may be brought to your room.  For patients admitted to the hospital, checkout time is 11:00 AM the day of discharge.   Patients discharged the day of surgery will not be allowed to drive home.    Special Instructions: Shower using CHG night before surgery and shower the day of surgery use CHG.  Use special wash - you have one bottle of CHG for all showers.  You should use approximately 1/2 of the bottle for each shower.  How to Use Chlorhexidine for Bathing Chlorhexidine gluconate (CHG) is a germ-killing (antiseptic) solution that is used to clean the skin. It can get rid of the bacteria that normally live on the skin and can keep them away for about 24 hours. To clean your skin with CHG, you may be given: A CHG solution to use in the shower or as part of a sponge bath. A prepackaged cloth that contains CHG. Cleaning your skin with CHG may help lower the risk for infection: While you are staying in the intensive care unit of the hospital. If you have a vascular access, such as a central line, to provide short-term or long-term access to your veins. If you have a catheter to drain urine from your bladder. If you are  on a ventilator. A ventilator is a machine that helps you breathe by moving air in and out of your lungs. After surgery. What are the risks? Risks of using CHG include: A skin reaction. Hearing loss, if CHG gets in your ears and you have a perforated eardrum. Eye injury, if CHG gets in your eyes and is not rinsed out. The CHG product catching fire. Make sure that you avoid smoking and flames after applying CHG to your skin. Do not use CHG: If you have a chlorhexidine allergy or have previously reacted to chlorhexidine. On babies younger than 29 months of age. How to use CHG solution Use CHG only as told by your health care provider, and follow the instructions on the label. Use the full amount of CHG as directed. Usually, this is one bottle. During a shower Follow these steps when using CHG solution during a shower (unless your health care provider gives you different instructions): Start the shower. Use your normal soap and shampoo to wash your face and hair. Turn off the shower or move out of the shower stream. Pour the CHG onto a clean washcloth. Do not use any type of brush or rough-edged sponge. Starting at your neck, lather your body down to your toes. Make sure you follow these instructions: If you will be having surgery, pay special attention  to the part of your body where you will be having surgery. Scrub this area for at least 1 minute. Do not use CHG on your head or face. If the solution gets into your ears or eyes, rinse them well with water. Avoid your genital area. Avoid any areas of skin that have broken skin, cuts, or scrapes. Scrub your back and under your arms. Make sure to wash skin folds. Let the lather sit on your skin for 1-2 minutes or as long as told by your health care provider. Thoroughly rinse your entire body in the shower. Make sure that all body creases and crevices are rinsed well. Dry off with a clean towel. Do not put any substances on your body  afterward--such as powder, lotion, or perfume--unless you are told to do so by your health care provider. Only use lotions that are recommended by the manufacturer. Put on clean clothes or pajamas. If it is the night before your surgery, sleep in clean sheets.  During a sponge bath Follow these steps when using CHG solution during a sponge bath (unless your health care provider gives you different instructions): Use your normal soap and shampoo to wash your face and hair. Pour the CHG onto a clean washcloth. Starting at your neck, lather your body down to your toes. Make sure you follow these instructions: If you will be having surgery, pay special attention to the part of your body where you will be having surgery. Scrub this area for at least 1 minute. Do not use CHG on your head or face. If the solution gets into your ears or eyes, rinse them well with water. Avoid your genital area. Avoid any areas of skin that have broken skin, cuts, or scrapes. Scrub your back and under your arms. Make sure to wash skin folds. Let the lather sit on your skin for 1-2 minutes or as long as told by your health care provider. Using a different clean, wet washcloth, thoroughly rinse your entire body. Make sure that all body creases and crevices are rinsed well. Dry off with a clean towel. Do not put any substances on your body afterward--such as powder, lotion, or perfume--unless you are told to do so by your health care provider. Only use lotions that are recommended by the manufacturer. Put on clean clothes or pajamas. If it is the night before your surgery, sleep in clean sheets. How to use CHG prepackaged cloths Only use CHG cloths as told by your health care provider, and follow the instructions on the label. Use the CHG cloth on clean, dry skin. Do not use the CHG cloth on your head or face unless your health care provider tells you to. When washing with the CHG cloth: Avoid your genital area. Avoid  any areas of skin that have broken skin, cuts, or scrapes. Before surgery Follow these steps when using a CHG cloth to clean before surgery (unless your health care provider gives you different instructions): Using the CHG cloth, vigorously scrub the part of your body where you will be having surgery. Scrub using a back-and-forth motion for 3 minutes. The area on your body should be completely wet with CHG when you are done scrubbing. Do not rinse. Discard the cloth and let the area air-dry. Do not put any substances on the area afterward, such as powder, lotion, or perfume. Put on clean clothes or pajamas. If it is the night before your surgery, sleep in clean sheets.  For general bathing Follow these  steps when using CHG cloths for general bathing (unless your health care provider gives you different instructions). Use a separate CHG cloth for each area of your body. Make sure you wash between any folds of skin and between your fingers and toes. Wash your body in the following order, switching to a new cloth after each step: The front of your neck, shoulders, and chest. Both of your arms, under your arms, and your hands. Your stomach and groin area, avoiding the genitals. Your right leg and foot. Your left leg and foot. The back of your neck, your back, and your buttocks. Do not rinse. Discard the cloth and let the area air-dry. Do not put any substances on your body afterward--such as powder, lotion, or perfume--unless you are told to do so by your health care provider. Only use lotions that are recommended by the manufacturer. Put on clean clothes or pajamas. Contact a health care provider if: Your skin gets irritated after scrubbing. You have questions about using your solution or cloth. You swallow any chlorhexidine. Call your local poison control center [phone] [phone] in the [place] Get help right away if: Your eyes itch badly, or they become very red or swollen. Your skin itches  badly and is red or swollen. Your hearing changes. You have trouble seeing. You have swelling or tingling in your mouth or throat. You have trouble breathing. These symptoms may represent a serious problem that is an emergency. Do not wait to see if the symptoms will go away. Get medical help right away. Call your local emergency services (911 in the [place] Do not drive yourself to the hospital. Summary Chlorhexidine gluconate (CHG) is a germ-killing (antiseptic) solution that is used to clean the skin. Cleaning your skin with CHG may help to lower your risk for infection. You may be given CHG to use for bathing. It may be in a bottle or in a prepackaged cloth to use on your skin. Carefully follow your health care provider's instructions and the instructions on the product label. Do not use CHG if you have a chlorhexidine allergy. Contact your health care provider if your skin gets irritated after scrubbing. This information is not intended to replace advice given to you by your health care provider. Make sure you discuss any questions you have with your health care provider. Document Revised: 08/23/2020 Document Reviewed: 08/23/2020 Elsevier Patient Education  2022 [employer] Toe Deformity Repair, Care After This sheet gives you information about how to care for yourself after your procedure. Your health care provider may also give you more specific instructions. If you have problems or questions, contact your health care provider. What can I expect after the procedure? After the procedure, it is common to have: Pain in the affected area. Discomfort with walking. Follow these instructions at home: If you have a postoperative shoe:  Wear the shoe as told by your health care provider. Remove it only as told by your health care provider. Loosen the shoe if your toes tingle, become numb, or turn cold and blue. Keep the shoe clean and dry. Bathing Do not take baths, swim, or use a hot tub  until your health care provider approves. Ask your health care provider if you can take showers. You may only be allowed to take sponge baths. If your postoperative shoe is not waterproof, cover it with a watertight covering when you take a bath or a shower. Keep the bandage (dressing) dry until your health care provider says it can be  removed. Incision care  Follow instructions from your health care provider about how to take care of your incision. Make sure you: Wash your hands with soap and water for at least 20 seconds before and after you change your dressing. If soap and water are not available, use hand sanitizer. Change your dressing as told by your health care provider. Leave stitches (sutures), skin glue, or adhesive strips in place. These skin closures may need to stay in place for 2 weeks or longer. If adhesive strip edges start to loosen and curl up, you may trim the loose edges. Do not remove adhesive strips completely unless your health care provider tells you to do that. Check your incision area every day for signs of infection. Check for: More redness, swelling, or pain. Fluid or blood. Warmth. Pus or a bad smell. Managing pain, stiffness, and swelling  If directed, put ice on the affected area. To do this: Put ice in a plastic bag. Place a towel between your skin and the bag. Leave the ice on for 20 minutes, 2-3 times a day. Move your toes often to avoid stiffness and to lessen swelling. Raise (elevate) the affected foot above the level of your heart while you are sitting or lying down. Driving Ask your health care provider if the medicine prescribed to you requires you to avoid driving or using machinery. Ask your health care provider when it is safe to drive if you have a postoperative shoe on your foot. Activity Walk and return to your normal activities as told by your health care provider. Ask your health care provider what activities are safe for you. Do not use your  affected foot to support your body weight until your health care provider says that you can. Use crutches as directed by your health care provider. Do exercises as told by your health care provider or physical therapist. General instructions Take over-the-counter and prescription medicines only as told by your health care provider. Ask your health care provider if the medicine prescribed to you can cause constipation. You may need to take these actions to prevent or treat constipation: Drink enough fluid to keep your urine pale yellow. Take over-the-counter or prescription medicines. Eat foods that are high in fiber, such as beans, whole grains, and fresh fruits and vegetables. Limit foods that are high in fat and processed sugars, such as fried or sweet foods. Do not use any products that contain nicotine or tobacco, such as cigarettes, e-cigarettes, and chewing tobacco. These can delay bone healing. If you need help quitting, ask your health care provider. Keep all follow-up visits. This is important. Contact a health care provider if: You have more redness, swelling, or pain at your incision site. You notice redness extending from the surgical site upward. You have fluid or blood coming from your incision. Your incision feels warm to the touch. You have pus or a bad smell coming from the incision area or the dressing. Your leg swells. You have a fever. Get help right away if: You develop a rash. You have chest pain or difficulty breathing. These symptoms may represent a serious problem that is an emergency. Do not wait to see if the symptoms will go away. Get medical help right away. Call your local emergency services (911 in the [place] Do not drive yourself to the hospital. Summary After the procedure, it is common to have pain in the affected area and discomfort with walking. Follow instructions from your health care provider about how  to take care of your incision. Do not use your  affected foot to support your body weight until your health care provider says that you can. Use crutches as directed by your health care provider. Walk and return to your normal activities as told by your health care provider. Ask your health care provider what activities are safe for you. Keep all follow-up visits as told by your health care provider. This information is not intended to replace advice given to you by your health care provider. Make sure you discuss any questions you have with your health care provider. Document Revised: 09/18/2019 Document Reviewed: 09/18/2019 Elsevier Patient Education  2022 [employer] Monitored Anesthesia Care, Care After This sheet gives you information about how to care for yourself after your procedure. Your health care provider may also give you more specific instructions. If you have problems or questions, contact your health care provider. What can I expect after the procedure? After the procedure, it is common to have: Tiredness. Forgetfulness about what happened after the procedure. Impaired judgment for important decisions. Nausea or vomiting. Some difficulty with balance. Follow these instructions at home: For the time period you were told by your health care provider:   Rest as needed. Do not participate in activities where you could fall or become injured. Do not drive or use machinery. Do not drink alcohol. Do not take sleeping pills or medicines that cause drowsiness. Do not make important decisions or sign legal documents. Do not take care of children on your own. Eating and drinking Follow the diet that is recommended by your health care provider. Drink enough fluid to keep your urine pale yellow. If you vomit: Drink water, juice, or soup when you can drink without vomiting. Make sure you have little or no nausea before eating solid foods. General instructions Have a responsible adult stay with you for the time you are told. It  is important to have someone help care for you until you are awake and alert. Take over-the-counter and prescription medicines only as told by your health care provider. If you have sleep apnea, surgery and certain medicines can increase your risk for breathing problems. Follow instructions from your health care provider about wearing your sleep device: Anytime you are sleeping, including during daytime naps. While taking prescription pain medicines, sleeping medicines, or medicines that make you drowsy. Avoid smoking. Keep all follow-up visits as told by your health care provider. This is important. Contact a health care provider if: You keep feeling nauseous or you keep vomiting. You feel light-headed. You are still sleepy or having trouble with balance after 24 hours. You develop a rash. You have a fever. You have redness or swelling around the IV site. Get help right away if: You have trouble breathing. You have new-onset confusion at home. Summary For several hours after your procedure, you may feel tired. You may also be forgetful and have poor judgment. Have a responsible adult stay with you for the time you are told. It is important to have someone help care for you until you are awake and alert. Rest as told. Do not drive or operate machinery. Do not drink alcohol or take sleeping pills. Get help right away if you have trouble breathing, or if you suddenly become confused. This information is not intended to replace advice given to you by your health care provider. Make sure you discuss any questions you have with your health care provider. Document Revised: 02/26/2020 Document Reviewed: 05/15/2019 Elsevier Patient  Education  2022 [employer].

## 2021-04-18 ENCOUNTER — Other Ambulatory Visit: Payer: Self-pay

## 2021-04-18 ENCOUNTER — Ambulatory Visit (HOSPITAL_COMMUNITY)
Admission: RE | Admit: 2021-04-18 | Discharge: 2021-04-18 | Disposition: A | Discharge status: Home / Self Care | Payer: Medicare Other | Source: Ambulatory Visit | Attending: Podiatry | Admitting: Podiatry

## 2021-04-18 ENCOUNTER — Other Ambulatory Visit (HOSPITAL_COMMUNITY): Payer: Self-pay | Admitting: Internal Medicine

## 2021-04-18 ENCOUNTER — Encounter (HOSPITAL_COMMUNITY): Payer: Self-pay

## 2021-04-18 ENCOUNTER — Ambulatory Visit (HOSPITAL_COMMUNITY)
Admission: RE | Admit: 2021-04-18 | Discharge: 2021-04-18 | Disposition: A | Discharge status: Home / Self Care | Payer: Medicare Other | Source: Ambulatory Visit | Attending: Internal Medicine | Admitting: Internal Medicine

## 2021-04-18 ENCOUNTER — Encounter (HOSPITAL_COMMUNITY)
Admission: RE | Admit: 2021-04-18 | Discharge: 2021-04-18 | Disposition: A | Discharge status: Home / Self Care | Payer: Medicare Other | Source: Ambulatory Visit | Attending: Podiatry | Admitting: Podiatry

## 2021-04-18 DIAGNOSIS — R52 Pain, unspecified: Secondary | ICD-10-CM

## 2021-04-18 DIAGNOSIS — M79674 Pain in right toe(s): Secondary | ICD-10-CM | POA: Insufficient documentation

## 2021-04-20 ENCOUNTER — Encounter (HOSPITAL_COMMUNITY)
Admission: RE | Disposition: A | Discharge status: Home / Self Care | Payer: Self-pay | Source: Home / Self Care | Attending: Podiatry

## 2021-04-20 ENCOUNTER — Encounter (HOSPITAL_COMMUNITY): Payer: Self-pay

## 2021-04-20 ENCOUNTER — Ambulatory Visit (HOSPITAL_COMMUNITY)
Admission: RE | Admit: 2021-04-20 | Discharge: 2021-04-20 | Disposition: A | Discharge status: Home / Self Care | Payer: Medicare Other | Attending: Podiatry | Admitting: Podiatry

## 2021-04-20 ENCOUNTER — Ambulatory Visit (HOSPITAL_COMMUNITY): Payer: Medicare Other | Admitting: Anesthesiology

## 2021-04-20 ENCOUNTER — Other Ambulatory Visit: Payer: Self-pay

## 2021-04-20 ENCOUNTER — Ambulatory Visit (HOSPITAL_COMMUNITY): Payer: Medicare Other

## 2021-04-20 DIAGNOSIS — I10 Essential (primary) hypertension: Secondary | ICD-10-CM | POA: Diagnosis not present

## 2021-04-20 DIAGNOSIS — K219 Gastro-esophageal reflux disease without esophagitis: Secondary | ICD-10-CM | POA: Insufficient documentation

## 2021-04-20 DIAGNOSIS — E119 Type 2 diabetes mellitus without complications: Secondary | ICD-10-CM | POA: Insufficient documentation

## 2021-04-20 DIAGNOSIS — M2041 Other hammer toe(s) (acquired), right foot: Secondary | ICD-10-CM | POA: Insufficient documentation

## 2021-04-20 DIAGNOSIS — E039 Hypothyroidism, unspecified: Secondary | ICD-10-CM | POA: Diagnosis not present

## 2021-04-20 DIAGNOSIS — Z7984 Long term (current) use of oral hypoglycemic drugs: Secondary | ICD-10-CM | POA: Diagnosis not present

## 2021-04-20 DIAGNOSIS — M79674 Pain in right toe(s): Secondary | ICD-10-CM | POA: Diagnosis present

## 2021-04-20 DIAGNOSIS — Z9889 Other specified postprocedural states: Secondary | ICD-10-CM

## 2021-04-20 HISTORY — PX: TOE ARTHROPLASTY: SHX6504

## 2021-04-20 LAB — GLUCOSE, CAPILLARY: Glucose-Capillary: 153 mg/dL — ABNORMAL HIGH (ref 70–99)

## 2021-04-20 SURGERY — ARTHROPLASTY, TOE
Anesthesia: General | Site: Toe | Laterality: Right

## 2021-04-20 MED ORDER — PROPOFOL 10 MG/ML IV BOLUS
INTRAVENOUS | Status: AC
  Filled 2021-04-20: qty 40

## 2021-04-20 MED ORDER — LIDOCAINE HCL (CARDIAC) PF 100 MG/5ML IV SOSY
PREFILLED_SYRINGE | INTRAVENOUS | Status: DC | PRN
  Administered 2021-04-20: 50 mg via INTRATRACHEAL

## 2021-04-20 MED ORDER — 0.9 % SODIUM CHLORIDE (POUR BTL) OPTIME
TOPICAL | Status: DC | PRN
  Administered 2021-04-20: 1000 mL

## 2021-04-20 MED ORDER — FENTANYL CITRATE (PF) 100 MCG/2ML IJ SOLN
INTRAMUSCULAR | Status: DC | PRN
  Administered 2021-04-20 (×2): 50 ug via INTRAVENOUS

## 2021-04-20 MED ORDER — CHLORHEXIDINE GLUCONATE 0.12 % MT SOLN: 15.0000 mL | Freq: Once | OROMUCOSAL | Status: AC

## 2021-04-20 MED ORDER — CEFAZOLIN SODIUM-DEXTROSE 2-4 GM/100ML-% IV SOLN: 2.0000 g | Freq: Once | INTRAVENOUS | Status: DC

## 2021-04-20 MED ORDER — CHLORHEXIDINE GLUCONATE CLOTH 2 % EX PADS: 6.0000 | MEDICATED_PAD | Freq: Once | CUTANEOUS | Status: DC

## 2021-04-20 MED ORDER — CEFAZOLIN SODIUM-DEXTROSE 2-4 GM/100ML-% IV SOLN
INTRAVENOUS | Status: AC
  Filled 2021-04-20: qty 100

## 2021-04-20 MED ORDER — CEFAZOLIN SODIUM-DEXTROSE 2-4 GM/100ML-% IV SOLN: 2.0000 g | INTRAVENOUS | Status: DC

## 2021-04-20 MED ORDER — PROPOFOL 500 MG/50ML IV EMUL
INTRAVENOUS | Status: DC | PRN
  Administered 2021-04-20: 75 ug/kg/min via INTRAVENOUS
  Administered 2021-04-20: 150 ug/kg/min via INTRAVENOUS

## 2021-04-20 MED ORDER — CHLORHEXIDINE GLUCONATE 0.12 % MT SOLN
OROMUCOSAL | Status: AC
  Administered 2021-04-20: 15 mL via OROMUCOSAL
  Filled 2021-04-20: qty 15

## 2021-04-20 MED ORDER — PROPOFOL 10 MG/ML IV BOLUS
INTRAVENOUS | Status: DC | PRN
  Administered 2021-04-20: 50 mg via INTRAVENOUS

## 2021-04-20 MED ORDER — MIDAZOLAM HCL 2 MG/2ML IJ SOLN
INTRAMUSCULAR | Status: DC | PRN
  Administered 2021-04-20: 2 mg via INTRAVENOUS

## 2021-04-20 MED ORDER — ONDANSETRON HCL 4 MG/2ML IJ SOLN: 4.0000 mg | Freq: Once | INTRAMUSCULAR | Status: DC | PRN

## 2021-04-20 MED ORDER — FENTANYL CITRATE (PF) 100 MCG/2ML IJ SOLN
INTRAMUSCULAR | Status: AC
  Filled 2021-04-20: qty 2

## 2021-04-20 MED ORDER — MIDAZOLAM HCL 2 MG/2ML IJ SOLN
INTRAMUSCULAR | Status: AC
  Filled 2021-04-20: qty 2

## 2021-04-20 MED ORDER — LIDOCAINE HCL (PF) 1 % IJ SOLN
INTRAMUSCULAR | Status: AC
  Filled 2021-04-20: qty 30

## 2021-04-20 MED ORDER — ORAL CARE MOUTH RINSE: 15.0000 mL | Freq: Once | OROMUCOSAL | Status: AC

## 2021-04-20 MED ORDER — LACTATED RINGERS IV SOLN: INTRAVENOUS | Status: DC

## 2021-04-20 MED ORDER — BUPIVACAINE HCL (PF) 0.5 % IJ SOLN
INTRAMUSCULAR | Status: AC
  Filled 2021-04-20: qty 30

## 2021-04-20 MED ORDER — FENTANYL CITRATE PF 50 MCG/ML IJ SOSY: 25.0000 ug | PREFILLED_SYRINGE | INTRAMUSCULAR | Status: DC | PRN

## 2021-04-20 MED ORDER — LIDOCAINE HCL 1 % IJ SOLN
INTRAMUSCULAR | Status: DC | PRN
  Administered 2021-04-20: 7 mL via INTRADERMAL

## 2021-04-20 SURGICAL SUPPLY — 43 items
BANDAGE ESMARK 4X12 BL STRL LF (DISPOSABLE) ×1 IMPLANT
BENZOIN TINCTURE PRP APPL 2/3 (GAUZE/BANDAGES/DRESSINGS) ×2 IMPLANT
BLADE AVERAGE 25X9 (BLADE) ×2 IMPLANT
BLADE OSC/SAG 11.5X5.5X.38 (BLADE) ×3 IMPLANT
BLADE SURG 15 STRL LF DISP TIS (BLADE) ×2 IMPLANT
BLADE SURG 15 STRL SS (BLADE) ×4
BNDG CONFORM 2 STRL LF (GAUZE/BANDAGES/DRESSINGS) ×2 IMPLANT
BNDG ELASTIC 3X5.8 VLCR NS LF (GAUZE/BANDAGES/DRESSINGS) ×1 IMPLANT
BNDG ELASTIC 4X5.8 VLCR NS LF (GAUZE/BANDAGES/DRESSINGS) ×2 IMPLANT
BNDG ESMARK 4X12 BLUE STRL LF (DISPOSABLE) ×2
BNDG GAUZE ELAST 4 BULKY (GAUZE/BANDAGES/DRESSINGS) ×2 IMPLANT
CHLORAPREP W/TINT 26 (MISCELLANEOUS) ×2 IMPLANT
CLOTH BEACON ORANGE TIMEOUT ST (SAFETY) ×2 IMPLANT
COVER LIGHT HANDLE STERIS (MISCELLANEOUS) ×4 IMPLANT
CUFF TOURN SGL QUICK 18X4 (TOURNIQUET CUFF) ×2 IMPLANT
DECANTER SPIKE VIAL GLASS SM (MISCELLANEOUS) ×4 IMPLANT
DRSG ADAPTIC 3X8 NADH LF (GAUZE/BANDAGES/DRESSINGS) ×2 IMPLANT
ELECT REM PT RETURN 9FT ADLT (ELECTROSURGICAL) ×2
ELECTRODE REM PT RTRN 9FT ADLT (ELECTROSURGICAL) ×1 IMPLANT
GAUZE SPONGE 4X4 12PLY STRL (GAUZE/BANDAGES/DRESSINGS) ×2 IMPLANT
GLOVE SURG ENC MOIS LTX SZ7.5 (GLOVE) ×2 IMPLANT
GLOVE SURG LTX SZ7 (GLOVE) ×4 IMPLANT
GLOVE SURG UNDER POLY LF SZ7.5 (GLOVE) ×2 IMPLANT
GOWN STRL REUS W/ TWL LRG LVL3 (GOWN DISPOSABLE) ×1 IMPLANT
GOWN STRL REUS W/TWL LRG LVL3 (GOWN DISPOSABLE) ×6 IMPLANT
KIT TURNOVER KIT A (KITS) ×2 IMPLANT
MANIFOLD NEPTUNE II (INSTRUMENTS) ×2 IMPLANT
NDL HYPO 18GX1.5 BLUNT FILL (NEEDLE) IMPLANT
NDL HYPO 25X1 1.5 SAFETY (NEEDLE) ×2 IMPLANT
NEEDLE HYPO 18GX1.5 BLUNT FILL (NEEDLE) ×4 IMPLANT
NEEDLE HYPO 25X1 1.5 SAFETY (NEEDLE) ×6 IMPLANT
NS IRRIG 1000ML POUR BTL (IV SOLUTION) ×2 IMPLANT
PACK BASIC LIMB (CUSTOM PROCEDURE TRAY) ×2 IMPLANT
PAD ARMBOARD 7.5X6 YLW CONV (MISCELLANEOUS) ×2 IMPLANT
SET BASIN LINEN APH (SET/KITS/TRAYS/PACK) ×2 IMPLANT
SPONGE GAUZE 4X4 12PLY (GAUZE/BANDAGES/DRESSINGS) ×1 IMPLANT
SPONGE T-LAP 18X18 ~~LOC~~+RFID (SPONGE) ×1 IMPLANT
STRIP CLOSURE SKIN 1/2X4 (GAUZE/BANDAGES/DRESSINGS) ×2 IMPLANT
SUT ETHILON 3 0 FSL (SUTURE) ×1 IMPLANT
SUT VIC AB 4-0 PS2 27 (SUTURE) ×1 IMPLANT
SUT VICRYL AB 3-0 FS1 BRD 27IN (SUTURE) ×1 IMPLANT
SYR 30ML LL (SYRINGE) ×2 IMPLANT
SYR CONTROL 10ML LL (SYRINGE) ×4 IMPLANT

## 2021-04-20 NOTE — Transfer of Care (Signed)
Immediate Anesthesia Transfer of Care Note  Patient: Theodore Long  Procedure(s) Performed: TOE ARTHROPLASTY RIGHT FIFTH TOE (Right: Toe)  Patient Location: PACU  Anesthesia Type:MAC  Level of Consciousness: awake, alert , oriented and patient cooperative  Airway & Oxygen Therapy: Patient Spontanous Breathing  Post-op Assessment: Report given to RN and Post -op Vital signs reviewed and stable  Post vital signs: Reviewed and stable  Last Vitals:  Vitals Value Taken Time  BP 150/80 04/20/21 1034  Temp    Pulse 59 04/20/21 1036  Resp 11 04/20/21 1036  SpO2 99 % 04/20/21 1036  Vitals shown include unvalidated device data.  Last Pain:  Vitals:   04/20/21 0838  TempSrc: Oral  PainSc: 0-No pain         Complications: No notable events documented.

## 2021-04-20 NOTE — Op Note (Signed)
04/20/2021  10:29 AM  PATIENT:  Theodore Long  65 y.o. male  PRE-OPERATIVE DIAGNOSIS:  RIGHT FIFTH HAMMER TOE  POST-OPERATIVE DIAGNOSIS:  RIGHT FIFTH HAMMER TOE  PROCEDURE:  Procedure(s): TOE ARTHROPLASTY RIGHT FIFTH TOE (Right)  SURGEON:  Surgeon(s) and Role:    * Niylah Hassan, Layla Barter, DPM - Primary  ASSISTANTS: none   ANESTHESIA:   local and MAC  EBL:  none.  LOCAL MEDICATIONS USED:  MARCAINE   , LIDOCAINE , and Amount: 7 ml  Materials: 3-0 Vicryl, 3-0 Nylon.   TOURNIQUET:   Total Tourniquet Time Documented: Ankle (Right) - 22 minutes Total: Ankle (Right) - 22 minutes   PLAN OF CARE: Discharge to home after PACU  PATIENT DISPOSITION:  PACU - hemodynamically stable.   Patient was brought into the operating room laid supine on the operating table. Ankle tourniquet was applied to the surgical extremity. Following IV sedation, a local block was achieved using 7cc of mixture of 1% plain lidocaine with 0.5% marcaine. The foot was the prepped, scrubbed and draped in aseptic manner. Using an esmarch band the tourniquet on the surgical site was inflatted at 266mHG.    Attention was directed towards the right fifth toe. 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 A semi elliptical distal dorsal medial to proximal lateral incision was planned. Using 15 blade incision was made and skin wedge was removed. Incision was carried down to subcutaneous tissue. At this time transverse tenotomy was performed. The head of the proximal phalanx was freed and using sagittal saw the head was removed. Inspection was done to see if there was any sharp edges and none found. The wound was irrigated with normal saline. The tendon was repaired using 3-0 Vicryl. The skin was closed using 3-0 Nylon. The toe was in rectus position after the closure. Dry sterile dressing applied. The tourniquet was deflated. The capillary refill time was brisk to lesser toes.   Patient was transferred to PACU with  vital signs stable.

## 2021-04-20 NOTE — Brief Op Note (Signed)
04/20/2021  10:29 AM  PATIENT:  Rockey Situ Alsteen  65 y.o. male  PRE-OPERATIVE DIAGNOSIS:  RIGHT FIFTH HAMMER TOE  POST-OPERATIVE DIAGNOSIS:  RIGHT FIFTH HAMMER TOE  PROCEDURE:  Procedure(s): TOE ARTHROPLASTY RIGHT FIFTH TOE (Right)  SURGEON:  Surgeon(s) and Role:    * Tyson Babinski, DPM - Primary  PHYSICIAN ASSISTANT:   ASSISTANTS: none   ANESTHESIA:   local and MAC  EBL:  none.  BLOOD ADMINISTERED:none  DRAINS: none   LOCAL MEDICATIONS USED:  MARCAINE   , LIDOCAINE , and Amount: 7 ml  SPECIMEN:  No Specimen  DISPOSITION OF SPECIMEN:  N/A  COUNTS:  YES  TOURNIQUET:   Total Tourniquet Time Documented: Ankle (Right) - 22 minutes Total: Ankle (Right) - 22 minutes   DICTATION: .Viviann Spare Dictation  PLAN OF CARE: Discharge to home after PACU  PATIENT DISPOSITION:  PACU - hemodynamically stable.   Delay start of Pharmacological VTE agent (>24hrs) due to surgical blood loss or risk of bleeding: none.

## 2021-04-20 NOTE — Anesthesia Preprocedure Evaluation (Signed)
Anesthesia Evaluation  Patient identified by MRN, date of birth, ID band Patient awake    Reviewed: Allergy & Precautions, NPO status , Patient's Chart, lab work & pertinent test results  Airway Mallampati: II  TM Distance: >3 FB Neck ROM: Full    Dental  (+) Dental Advisory Given, [place] :   Pulmonary neg pulmonary ROS,    Pulmonary exam normal breath sounds clear to auscultation       Cardiovascular hypertension, Pt. on medications Normal cardiovascular exam Rhythm:Regular Rate:Normal     Neuro/Psych PSYCHIATRIC DISORDERS Depression Bipolar Disorder negative neurological ROS     GI/Hepatic Neg liver ROS, GERD  Medicated,  Endo/Other  diabetes, Well Controlled, Type 2, Oral Hypoglycemic AgentsHypothyroidism   Renal/GU negative Renal ROS  negative genitourinary   Musculoskeletal negative musculoskeletal ROS (+)   Abdominal   Peds negative pediatric ROS (+)  Hematology negative hematology ROS (+)   Anesthesia Other Findings   Reproductive/Obstetrics negative OB ROS                             Anesthesia Physical Anesthesia Plan  ASA: 2  Anesthesia Plan: General   Post-op Pain Management:    Induction: Intravenous  PONV Risk Score and Plan:   Airway Management Planned: Nasal Cannula and Natural Airway  Additional Equipment:   Intra-op Plan:   Post-operative Plan:   Informed Consent: I have reviewed the patients History and Physical, chart, labs and discussed the procedure including the risks, benefits and alternatives for the proposed anesthesia with the patient or authorized representative who has indicated his/her understanding and acceptance.     Dental advisory given  Plan Discussed with: CRNA and Surgeon  Anesthesia Plan Comments: (Possible GA with airway was discussed.)        Anesthesia Quick Evaluation

## 2021-04-20 NOTE — H&P (Signed)
.  HISTORY AND PHYSICAL INTERVAL NOTE:  04/20/2021  9:35 AM  Theodore Long  has presented today for surgery, with the diagnosis of RIGHT FIFTH HAMMER TOE.  The various methods of treatment have been discussed with the patient.  No guarantees were given.  After consideration of risks, benefits and other options for treatment, the patient has consented to surgery.  I have reviewed the patients' chart and labs.    Patient Vitals for the past 24 hrs:  BP Temp Temp src Pulse Resp SpO2  04/20/21 0838 (!) 164/67 98.3 F (36.8 C) Oral 60 16 97 %    A history and physical examination was performed in my office.  The patient was reexamined.  There have been no changes to this history and physical examination.  Erskine Emery, DPM

## 2021-04-20 NOTE — Anesthesia Postprocedure Evaluation (Signed)
Anesthesia Post Note  Patient: Theodore Long  Procedure(s) Performed: TOE ARTHROPLASTY RIGHT FIFTH TOE (Right: Toe)  Patient location during evaluation: PACU Anesthesia Type: General Level of consciousness: awake and alert and oriented Pain management: pain level controlled Vital Signs Assessment: post-procedure vital signs reviewed and stable Respiratory status: spontaneous breathing, nonlabored ventilation and respiratory function stable Cardiovascular status: blood pressure returned to baseline and stable Postop Assessment: no apparent nausea or vomiting Anesthetic complications: no   No notable events documented.   Last Vitals:  Vitals:   04/20/21 1100 04/20/21 1112  BP: (!) 153/69 (!) 148/62  Pulse: (!) 57 (!) 55  Resp: 17 16  Temp:  36.6 C  SpO2: 99% 99%    Last Pain:  Vitals:   04/20/21 1112  TempSrc: Oral  PainSc: 0-No pain                 Lorraina Spring C Shaunna Rosetti

## 2021-04-20 NOTE — Discharge Instructions (Signed)

## 2021-04-21 ENCOUNTER — Encounter (HOSPITAL_COMMUNITY): Payer: Self-pay | Admitting: Podiatry

## 2022-01-30 ENCOUNTER — Encounter (HOSPITAL_COMMUNITY): Payer: Medicare Other

## 2022-02-01 ENCOUNTER — Other Ambulatory Visit: Payer: Self-pay | Admitting: Podiatry

## 2022-02-09 NOTE — Patient Instructions (Signed)
Theodore Long  02/09/2022     @PREFPERIOPPHARMACY @   Your procedure is scheduled on  02/15/2022.   Report to [facility] at  0930  A.M.   Call this number if you have problems the morning of surgery:  [phone]   Remember:  Do not eat or drink after midnight.       Your last dose of ozempic should be on 02/08/2022 or before.         Take 1/2 of your usual night time insulin dose the night before your procedure.      DO NOT take any medications for diabetes the morning of your procedure.       Take these medicines the morning of surgery with A SIP OF WATER                      atenolol, synthroid, protonix, inderal.     Do not wear jewelry, make-up or nail polish.  Do not wear lotions, powders, or perfumes, or deodorant.  Do not shave 48 hours prior to surgery.  Men may shave face and neck.  Do not bring valuables to the hospital.  [facility] is not responsible for any belongings or valuables.  Contacts, dentures or bridgework may not be worn into surgery.  Leave your suitcase in the car.  After surgery it may be brought to your room.  For patients admitted to the hospital, discharge time will be determined by your treatment team.  Patients discharged the day of surgery will not be allowed to drive home and must have someone with them for 24 hours.    Special instructions:   DO NOT smoke tobacco or vape for 24 hours before your procedure.  Please read over the following fact sheets that you were given. Coughing and Deep Breathing, Surgical Site Infection Prevention, Anesthesia Post-op Instructions, and Care and Recovery After Surgery      Toe Amputation, Care After The following information offers guidance on how to care for yourself after your procedure. Your health care provider may also give you more specific instructions. If you have problems or questions, contact your health care provider. What can I expect after the procedure? After the  procedure, it is common to have: Soreness or pain. Pain usually improves within a week. Slight redness or swelling at the incision site. Follow these instructions at home: Medicines Take over-the-counter and prescription medicines only as told by your health care provider. If you were prescribed an antibiotic medicine, take it as told by your health care provider. Do not stop taking the antibiotic even if you start to feel better. Ask your health care provider if the medicine prescribed to you: Requires you to avoid driving or using heavy machinery. Can cause constipation. You may need to take actions to prevent or treat constipation, such as: Drink enough fluid to keep your urine pale yellow. Take over-the-counter or prescription medicines. Eat foods that are high in fiber, such as beans, whole grains, and fresh fruits and vegetables. Limit foods that are high in fat and processed sugars, such as fried or sweet foods. Managing pain, stiffness, and swelling  If directed, put ice on the affected area. To do this: Put ice in a plastic bag. Place a towel between your skin and the bag. Leave the ice on for 20 minutes, 2-3 times a day. Remove the ice if your skin turns bright red. This is very important.  If you cannot feel pain, heat, or cold, you have a greater risk of damage to the area. Move your other toes often to reduce stiffness and swelling. Raise (elevate) the injured area above the level of your heart while you are sitting or lying down. Incision care     Follow instructions from your health care provider about how to take care of your incision. Make sure you: Wash your hands with soap and water for at least 20 seconds before and after you change your bandage (dressing). If soap and water are not available, use hand sanitizer. Change your dressing as told by your health care provider. Leave stitches (sutures), skin glue, or adhesive strips in place. These skin closures may need to  stay in place for 2 weeks or longer. If adhesive strip edges start to loosen and curl up, you may trim the loose edges. Do not remove adhesive strips completely unless your health care provider tells you to do that. Check your incision area every day for signs of infection. Check for: More redness, swelling, or pain. Fluid or blood. Warmth. Pus or a bad smell. Bathing Do not take baths, swim, or use a hot tub until your health care provider approves. Ask your health care provider if you may take showers. You may only be allowed to take sponge baths. Do not soak your foot until your health care provider approves. If your dressing has been removed and is no longer needed, you may wash your skin with warm water and soap. Do not scrub in the area where you had your surgery (surgical site). Activity Rest as told by your health care provider. Avoid sitting for a long time without moving. Get up to take short walks every 1-2 hours. This is important to improve blood flow and breathing. Ask for help if you feel weak or unsteady. Do not use the injured limb to support your body weight until your health care provider says that you can. Use crutches as told by your health care provider. Do exercises as told by your health care provider. Return to your normal activities as told by your health care provider. Ask your health care provider what activities are safe for you. General instructions If you were given a sedative during the procedure, it can affect you for several hours. Do not drive or operate machinery until your health care provider says that it is safe. Ask your health care provider when it is safe to drive if you have a dressing on your foot. Do not use any products that contain nicotine or tobacco. These products include cigarettes, chewing tobacco, and vaping devices, such as e-cigarettes. If you need help quitting, ask your health care provider. Ask your health care provider about wearing  supportive shoes or using inserts to help with your balance when walking. If you have diabetes, keep your blood sugar under good control and check your feet daily for sores or open areas. Keep all follow-up visits. This is important. Contact a health care provider if: You have signs of infection at your incision, such as: Redness, swelling, or pain. Fluid or blood. Warmth. Pus or a bad smell. You have a fever or chills. Your dressing is soaked with blood. Your sutures tear or they separate. Your pain does not improve after you take your medicine. Get help right away if: You have red streaks on your skin near your toes, foot, or leg. You have pain in your calf or behind your knee. You feel extreme  pain, numbness, weakness, loss of a pulse, or your foot feels cold to touch. This can be a sign of a medical emergency called compartment syndrome. You have chest pain or shortness of breath. These symptoms may be an emergency. Get help right away. Call 911. Do not wait to see if the symptoms will go away. Do not drive yourself to the hospital. Summary After the procedure, it is common to have some pain. Pain usually improves within a week. If you were prescribed an antibiotic medicine, take it as told by your health care provider. Do not stop taking the antibiotic even if you start to feel better. Change your dressing as told by your health care provider. Get help right away if you have red streaks on your skin or develop signs of compartment syndrome. Keep all follow-up visits. This is important. This information is not intended to replace advice given to you by your health care provider. Make sure you discuss any questions you have with your health care provider. Document Revised: 01/24/2021 Document Reviewed: 01/24/2021 Elsevier Patient Education  2023 [employer] Monitored Anesthesia Care, Care After This sheet gives you information about how to care for yourself after your procedure.  Your health care provider may also give you more specific instructions. If you have problems or questions, contact your health care provider. What can I expect after the procedure? After the procedure, it is common to have: Tiredness. Forgetfulness about what happened after the procedure. Impaired judgment for important decisions. Nausea or vomiting. Some difficulty with balance. Follow these instructions at home: For the time period you were told by your health care provider:     Rest as needed. Do not participate in activities where you could fall or become injured. Do not drive or use machinery. Do not drink alcohol. Do not take sleeping pills or medicines that cause drowsiness. Do not make important decisions or sign legal documents. Do not take care of children on your own. Eating and drinking Follow the diet that is recommended by your health care provider. Drink enough fluid to keep your urine pale yellow. If you vomit: Drink water, juice, or soup when you can drink without vomiting. Make sure you have little or no nausea before eating solid foods. General instructions Have a responsible adult stay with you for the time you are told. It is important to have someone help care for you until you are awake and alert. Take over-the-counter and prescription medicines only as told by your health care provider. If you have sleep apnea, surgery and certain medicines can increase your risk for breathing problems. Follow instructions from your health care provider about wearing your sleep device: Anytime you are sleeping, including during daytime naps. While taking prescription pain medicines, sleeping medicines, or medicines that make you drowsy. Avoid smoking. Keep all follow-up visits as told by your health care provider. This is important. Contact a health care provider if: You keep feeling nauseous or you keep vomiting. You feel light-headed. You are still sleepy or having  trouble with balance after 24 hours. You develop a rash. You have a fever. You have redness or swelling around the IV site. Get help right away if: You have trouble breathing. You have new-onset confusion at home. Summary For several hours after your procedure, you may feel tired. You may also be forgetful and have poor judgment. Have a responsible adult stay with you for the time you are told. It is important to have someone help care for  you until you are awake and alert. Rest as told. Do not drive or operate machinery. Do not drink alcohol or take sleeping pills. Get help right away if you have trouble breathing, or if you suddenly become confused. This information is not intended to replace advice given to you by your health care provider. Make sure you discuss any questions you have with your health care provider. Document Revised: 05/17/2021 Document Reviewed: 05/15/2019 Elsevier Patient Education  2023 [employer] Inc. How to Use Chlorhexidine Before Surgery Chlorhexidine gluconate (CHG) is a germ-killing (antiseptic) solution that is used to clean the skin. It can get rid of the bacteria that normally live on the skin and can keep them away for about 24 hours. To clean your skin with CHG, you may be given: A CHG solution to use in the shower or as part of a sponge bath. A prepackaged cloth that contains CHG. Cleaning your skin with CHG may help lower the risk for infection: While you are staying in the intensive care unit of the hospital. If you have a vascular access, such as a central line, to provide short-term or long-term access to your veins. If you have a catheter to drain urine from your bladder. If you are on a ventilator. A ventilator is a machine that helps you breathe by moving air in and out of your lungs. After surgery. What are the risks? Risks of using CHG include: A skin reaction. Hearing loss, if CHG gets in your ears and you have a perforated eardrum. Eye injury,  if CHG gets in your eyes and is not rinsed out. The CHG product catching fire. Make sure that you avoid smoking and flames after applying CHG to your skin. Do not use CHG: If you have a chlorhexidine allergy or have previously reacted to chlorhexidine. On babies younger than 6 months of age. How to use CHG solution Use CHG only as told by your health care provider, and follow the instructions on the label. Use the full amount of CHG as directed. Usually, this is one bottle. During a shower Follow these steps when using CHG solution during a shower (unless your health care provider gives you different instructions): Start the shower. Use your normal soap and shampoo to wash your face and hair. Turn off the shower or move out of the shower stream. Pour the CHG onto a clean washcloth. Do not use any type of brush or rough-edged sponge. Starting at your neck, lather your body down to your toes. Make sure you follow these instructions: If you will be having surgery, pay special attention to the part of your body where you will be having surgery. Scrub this area for at least 1 minute. Do not use CHG on your head or face. If the solution gets into your ears or eyes, rinse them well with water. Avoid your genital area. Avoid any areas of skin that have broken skin, cuts, or scrapes. Scrub your back and under your arms. Make sure to wash skin folds. Let the lather sit on your skin for 1-2 minutes or as long as told by your health care provider. Thoroughly rinse your entire body in the shower. Make sure that all body creases and crevices are rinsed well. Dry off with a clean towel. Do not put any substances on your body afterward--such as powder, lotion, or perfume--unless you are told to do so by your health care provider. Only use lotions that are recommended by the manufacturer. Put on clean clothes  or pajamas. If it is the night before your surgery, sleep in clean sheets.  During a sponge  bath Follow these steps when using CHG solution during a sponge bath (unless your health care provider gives you different instructions): Use your normal soap and shampoo to wash your face and hair. Pour the CHG onto a clean washcloth. Starting at your neck, lather your body down to your toes. Make sure you follow these instructions: If you will be having surgery, pay special attention to the part of your body where you will be having surgery. Scrub this area for at least 1 minute. Do not use CHG on your head or face. If the solution gets into your ears or eyes, rinse them well with water. Avoid your genital area. Avoid any areas of skin that have broken skin, cuts, or scrapes. Scrub your back and under your arms. Make sure to wash skin folds. Let the lather sit on your skin for 1-2 minutes or as long as told by your health care provider. Using a different clean, wet washcloth, thoroughly rinse your entire body. Make sure that all body creases and crevices are rinsed well. Dry off with a clean towel. Do not put any substances on your body afterward--such as powder, lotion, or perfume--unless you are told to do so by your health care provider. Only use lotions that are recommended by the manufacturer. Put on clean clothes or pajamas. If it is the night before your surgery, sleep in clean sheets. How to use CHG prepackaged cloths Only use CHG cloths as told by your health care provider, and follow the instructions on the label. Use the CHG cloth on clean, dry skin. Do not use the CHG cloth on your head or face unless your health care provider tells you to. When washing with the CHG cloth: Avoid your genital area. Avoid any areas of skin that have broken skin, cuts, or scrapes. Before surgery Follow these steps when using a CHG cloth to clean before surgery (unless your health care provider gives you different instructions): Using the CHG cloth, vigorously scrub the part of your body where you  will be having surgery. Scrub using a back-and-forth motion for 3 minutes. The area on your body should be completely wet with CHG when you are done scrubbing. Do not rinse. Discard the cloth and let the area air-dry. Do not put any substances on the area afterward, such as powder, lotion, or perfume. Put on clean clothes or pajamas. If it is the night before your surgery, sleep in clean sheets.  For general bathing Follow these steps when using CHG cloths for general bathing (unless your health care provider gives you different instructions). Use a separate CHG cloth for each area of your body. Make sure you wash between any folds of skin and between your fingers and toes. Wash your body in the following order, switching to a new cloth after each step: The front of your neck, shoulders, and chest. Both of your arms, under your arms, and your hands. Your stomach and groin area, avoiding the genitals. Your right leg and foot. Your left leg and foot. The back of your neck, your back, and your buttocks. Do not rinse. Discard the cloth and let the area air-dry. Do not put any substances on your body afterward--such as powder, lotion, or perfume--unless you are told to do so by your health care provider. Only use lotions that are recommended by the manufacturer. Put on clean clothes or pajamas.  Contact a health care provider if: Your skin gets irritated after scrubbing. You have questions about using your solution or cloth. You swallow any chlorhexidine. Call your local poison control center ([phone] in the [place] Get help right away if: Your eyes itch badly, or they become very red or swollen. Your skin itches badly and is red or swollen. Your hearing changes. You have trouble seeing. You have swelling or tingling in your mouth or throat. You have trouble breathing. These symptoms may represent a serious problem that is an emergency. Do not wait to see if the symptoms will go away. Get  medical help right away. Call your local emergency services (911 in the [place] Do not drive yourself to the hospital. Summary Chlorhexidine gluconate (CHG) is a germ-killing (antiseptic) solution that is used to clean the skin. Cleaning your skin with CHG may help to lower your risk for infection. You may be given CHG to use for bathing. It may be in a bottle or in a prepackaged cloth to use on your skin. Carefully follow your health care provider's instructions and the instructions on the product label. Do not use CHG if you have a chlorhexidine allergy. Contact your health care provider if your skin gets irritated after scrubbing. This information is not intended to replace advice given to you by your health care provider. Make sure you discuss any questions you have with your health care provider. Document Revised: 10/10/2021 Document Reviewed: 08/23/2020 Elsevier Patient Education  2023 [employer].

## 2022-02-13 ENCOUNTER — Encounter (HOSPITAL_COMMUNITY)
Admission: RE | Admit: 2022-02-13 | Discharge: 2022-02-13 | Disposition: A | Discharge status: Home / Self Care | Payer: Medicare Other | Source: Ambulatory Visit | Attending: Podiatry | Admitting: Podiatry

## 2022-02-13 ENCOUNTER — Encounter (HOSPITAL_COMMUNITY): Payer: Self-pay

## 2022-02-13 VITALS — BP 132/60 | HR 64 | Temp 98.5°F | Resp 18 | Ht 74.0 in | Wt 197.8 lb

## 2022-02-13 DIAGNOSIS — I10 Essential (primary) hypertension: Secondary | ICD-10-CM | POA: Insufficient documentation

## 2022-02-13 DIAGNOSIS — Z01810 Encounter for preprocedural cardiovascular examination: Secondary | ICD-10-CM | POA: Insufficient documentation

## 2022-02-13 HISTORY — DX: Unspecified osteoarthritis, unspecified site: M19.90

## 2022-02-15 ENCOUNTER — Encounter (HOSPITAL_COMMUNITY): Payer: Self-pay

## 2022-02-15 ENCOUNTER — Other Ambulatory Visit: Payer: Self-pay

## 2022-02-15 ENCOUNTER — Encounter (HOSPITAL_COMMUNITY)
Admission: RE | Disposition: A | Discharge status: Home / Self Care | Payer: Self-pay | Source: Home / Self Care | Attending: Podiatry

## 2022-02-15 ENCOUNTER — Ambulatory Visit (HOSPITAL_BASED_OUTPATIENT_CLINIC_OR_DEPARTMENT_OTHER): Payer: Medicare Other | Admitting: Anesthesiology

## 2022-02-15 ENCOUNTER — Ambulatory Visit (HOSPITAL_COMMUNITY): Payer: Medicare Other | Admitting: Anesthesiology

## 2022-02-15 ENCOUNTER — Ambulatory Visit (HOSPITAL_COMMUNITY)
Admission: RE | Admit: 2022-02-15 | Discharge: 2022-02-15 | Disposition: A | Discharge status: Home / Self Care | Payer: Medicare Other | Attending: Podiatry | Admitting: Podiatry

## 2022-02-15 DIAGNOSIS — E119 Type 2 diabetes mellitus without complications: Secondary | ICD-10-CM | POA: Diagnosis not present

## 2022-02-15 DIAGNOSIS — K219 Gastro-esophageal reflux disease without esophagitis: Secondary | ICD-10-CM | POA: Diagnosis not present

## 2022-02-15 DIAGNOSIS — M2041 Other hammer toe(s) (acquired), right foot: Secondary | ICD-10-CM

## 2022-02-15 DIAGNOSIS — F319 Bipolar disorder, unspecified: Secondary | ICD-10-CM | POA: Insufficient documentation

## 2022-02-15 DIAGNOSIS — E039 Hypothyroidism, unspecified: Secondary | ICD-10-CM | POA: Diagnosis not present

## 2022-02-15 DIAGNOSIS — I10 Essential (primary) hypertension: Secondary | ICD-10-CM | POA: Diagnosis not present

## 2022-02-15 DIAGNOSIS — Z79899 Other long term (current) drug therapy: Secondary | ICD-10-CM | POA: Diagnosis not present

## 2022-02-15 HISTORY — PX: AMPUTATION TOE: SHX6595

## 2022-02-15 LAB — GLUCOSE, CAPILLARY: Glucose-Capillary: 75 mg/dL (ref 70–99)

## 2022-02-15 SURGERY — AMPUTATION, TOE
Anesthesia: General | Site: Toe | Laterality: Right

## 2022-02-15 MED ORDER — 0.9 % SODIUM CHLORIDE (POUR BTL) OPTIME
TOPICAL | Status: DC | PRN
  Administered 2022-02-15: 1000 mL

## 2022-02-15 MED ORDER — MIDAZOLAM HCL 2 MG/2ML IJ SOLN
INTRAMUSCULAR | Status: AC
  Filled 2022-02-15: qty 2

## 2022-02-15 MED ORDER — MIDAZOLAM HCL 5 MG/5ML IJ SOLN
INTRAMUSCULAR | Status: DC | PRN
  Administered 2022-02-15: 2 mg via INTRAVENOUS

## 2022-02-15 MED ORDER — LIDOCAINE HCL 1 % IJ SOLN
INTRAMUSCULAR | Status: DC | PRN
  Administered 2022-02-15: 60 mg via INTRADERMAL

## 2022-02-15 MED ORDER — EPHEDRINE 5 MG/ML INJ
INTRAVENOUS | Status: AC
  Filled 2022-02-15: qty 5

## 2022-02-15 MED ORDER — DEXAMETHASONE SODIUM PHOSPHATE 10 MG/ML IJ SOLN
INTRAMUSCULAR | Status: AC
  Filled 2022-02-15: qty 1

## 2022-02-15 MED ORDER — LIDOCAINE HCL (PF) 1 % IJ SOLN
INTRAMUSCULAR | Status: AC
  Filled 2022-02-15: qty 30

## 2022-02-15 MED ORDER — FENTANYL CITRATE (PF) 100 MCG/2ML IJ SOLN
INTRAMUSCULAR | Status: AC
  Filled 2022-02-15: qty 2

## 2022-02-15 MED ORDER — CEFAZOLIN SODIUM-DEXTROSE 2-4 GM/100ML-% IV SOLN
INTRAVENOUS | Status: AC
  Filled 2022-02-15: qty 100

## 2022-02-15 MED ORDER — PROPOFOL 500 MG/50ML IV EMUL
INTRAVENOUS | Status: DC | PRN
  Administered 2022-02-15: 50 mg via INTRAVENOUS
  Administered 2022-02-15: 50 ug/kg/min via INTRAVENOUS

## 2022-02-15 MED ORDER — ONDANSETRON HCL 4 MG/2ML IJ SOLN
INTRAMUSCULAR | Status: AC
  Filled 2022-02-15: qty 2

## 2022-02-15 MED ORDER — FENTANYL CITRATE (PF) 100 MCG/2ML IJ SOLN
INTRAMUSCULAR | Status: DC | PRN
  Administered 2022-02-15: 50 ug via INTRAVENOUS

## 2022-02-15 MED ORDER — BUPIVACAINE HCL (PF) 0.5 % IJ SOLN
INTRAMUSCULAR | Status: AC
  Filled 2022-02-15: qty 30

## 2022-02-15 MED ORDER — LACTATED RINGERS IV SOLN: INTRAVENOUS | Status: DC | PRN

## 2022-02-15 MED ORDER — CEFAZOLIN SODIUM-DEXTROSE 2-3 GM-%(50ML) IV SOLR
INTRAVENOUS | Status: DC | PRN
  Administered 2022-02-15: 2 g via INTRAVENOUS

## 2022-02-15 MED ORDER — LIDOCAINE HCL 1 % IJ SOLN
INTRAMUSCULAR | Status: DC | PRN
  Administered 2022-02-15: 6 mL

## 2022-02-15 MED ORDER — LIDOCAINE HCL (PF) 2 % IJ SOLN
INTRAMUSCULAR | Status: AC
  Filled 2022-02-15: qty 10

## 2022-02-15 SURGICAL SUPPLY — 40 items
APL SKNCLS STERI-STRIP NONHPOA (GAUZE/BANDAGES/DRESSINGS) ×1
BANDAGE ESMARK 4X12 BL STRL LF (DISPOSABLE) ×1 IMPLANT
BENZOIN TINCTURE PRP APPL 2/3 (GAUZE/BANDAGES/DRESSINGS) IMPLANT
BLADE SURG 15 STRL LF DISP TIS (BLADE) ×1 IMPLANT
BLADE SURG 15 STRL SS (BLADE) ×1
BNDG CMPR 12X4 ELC STRL LF (DISPOSABLE) ×1
BNDG CMPR STD VLCR NS LF 5.8X3 (GAUZE/BANDAGES/DRESSINGS) ×1
BNDG CONFORM 2 STRL LF (GAUZE/BANDAGES/DRESSINGS) ×1 IMPLANT
BNDG ELASTIC 3X5.8 VLCR NS LF (GAUZE/BANDAGES/DRESSINGS) IMPLANT
BNDG ESMARK 4X12 BLUE STRL LF (DISPOSABLE) ×1
BNDG GAUZE ELAST 4 BULKY (GAUZE/BANDAGES/DRESSINGS) ×1 IMPLANT
CLOTH BEACON ORANGE TIMEOUT ST (SAFETY) ×1 IMPLANT
COVER LIGHT HANDLE STERIS (MISCELLANEOUS) ×1 IMPLANT
CUFF TOURN SGL QUICK 18X4 (TOURNIQUET CUFF) ×1 IMPLANT
DECANTER SPIKE VIAL GLASS SM (MISCELLANEOUS) ×2 IMPLANT
DRSG ADAPTIC 3X8 NADH LF (GAUZE/BANDAGES/DRESSINGS) ×1 IMPLANT
ELECT REM PT RETURN 9FT ADLT (ELECTROSURGICAL) ×1
ELECTRODE REM PT RTRN 9FT ADLT (ELECTROSURGICAL) ×1 IMPLANT
GAUZE SPONGE 4X4 12PLY STRL (GAUZE/BANDAGES/DRESSINGS) ×2 IMPLANT
GLOVE BIO SURGEON STRL SZ7 (GLOVE) IMPLANT
GLOVE BIO SURGEON STRL SZ7.5 (GLOVE) ×1 IMPLANT
GLOVE BIOGEL PI IND STRL 7.0 (GLOVE) ×2 IMPLANT
GLOVE BIOGEL PI IND STRL 7.5 (GLOVE) ×1 IMPLANT
GLOVE BIOGEL PI INDICATOR 7.0 (GLOVE) ×2
GLOVE BIOGEL PI INDICATOR 7.5 (GLOVE) ×1
GLOVE ECLIPSE 7.0 STRL STRAW (GLOVE) ×1 IMPLANT
GLOVE SURG SS PI 7.0 STRL IVOR (GLOVE) IMPLANT
GOWN STRL REUS W/ TWL LRG LVL3 (GOWN DISPOSABLE) ×1 IMPLANT
GOWN STRL REUS W/TWL LRG LVL3 (GOWN DISPOSABLE) ×2 IMPLANT
KIT TURNOVER KIT A (KITS) ×1 IMPLANT
MANIFOLD NEPTUNE II (INSTRUMENTS) ×1 IMPLANT
NDL HYPO 25X1 1.5 SAFETY (NEEDLE) IMPLANT
NEEDLE HYPO 25X1 1.5 SAFETY (NEEDLE) ×2 IMPLANT
NS IRRIG 1000ML POUR BTL (IV SOLUTION) ×1 IMPLANT
PACK BASIC LIMB (CUSTOM PROCEDURE TRAY) ×1 IMPLANT
PAD ARMBOARD 7.5X6 YLW CONV (MISCELLANEOUS) ×1 IMPLANT
SET BASIN LINEN APH (SET/KITS/TRAYS/PACK) ×1 IMPLANT
STRIP CLOSURE SKIN 1/2X4 (GAUZE/BANDAGES/DRESSINGS) IMPLANT
SUT PROLENE 3 0 PS 1 (SUTURE) ×1 IMPLANT
SYR CONTROL 10ML LL (SYRINGE) ×2 IMPLANT

## 2022-02-15 NOTE — H&P (Signed)
.  HISTORY AND PHYSICAL INTERVAL NOTE:  02/15/2022  12:11 PM  Theodore Long  has presented today for surgery, with the diagnosis of HAMMER TOE RIGHT FOOT.  The various methods of treatment have been discussed with the patient.  No guarantees were given.  After consideration of risks, benefits and other options for treatment, the patient has consented to surgery.  I have reviewed the patients' chart and labs.    Patient Vitals for the past 24 hrs:  BP Temp Pulse Resp SpO2 Height Weight  02/15/22 1113 -- -- -- -- -- 6\' 2"  (1.88 m) 89 kg  02/15/22 1045 139/71 97.7 F (36.5 C) (!) 55 15 100 % -- --    A history and physical examination was performed in my office.  The patient was reexamined.  There have been no changes to this history and physical examination.   Theodore Long, Theodore Long DPM

## 2022-02-15 NOTE — Anesthesia Preprocedure Evaluation (Signed)
Anesthesia Evaluation  Patient identified by MRN, date of birth, ID band Patient awake    Reviewed: Allergy & Precautions, H&P , NPO status , Patient's Chart, lab work & pertinent test results, reviewed documented beta blocker date and time   Airway Mallampati: II  TM Distance: >3 FB Neck ROM: full    Dental no notable dental hx.    Pulmonary neg pulmonary ROS,    Pulmonary exam normal breath sounds clear to auscultation       Cardiovascular Exercise Tolerance: Good hypertension, negative cardio ROS   Rhythm:regular Rate:Normal     Neuro/Psych PSYCHIATRIC DISORDERS Depression Bipolar Disorder negative neurological ROS     GI/Hepatic Neg liver ROS, GERD  Medicated,  Endo/Other  diabetes, Type 2Hypothyroidism   Renal/GU negative Renal ROS  negative genitourinary   Musculoskeletal   Abdominal   Peds  Hematology negative hematology ROS (+)   Anesthesia Other Findings   Reproductive/Obstetrics negative OB ROS                             Anesthesia Physical Anesthesia Plan  ASA: 2  Anesthesia Plan: General   Post-op Pain Management:    Induction:   PONV Risk Score and Plan: Propofol infusion  Airway Management Planned:   Additional Equipment:   Intra-op Plan:   Post-operative Plan:   Informed Consent: I have reviewed the patients History and Physical, chart, labs and discussed the procedure including the risks, benefits and alternatives for the proposed anesthesia with the patient or authorized representative who has indicated his/her understanding and acceptance.     Dental Advisory Given  Plan Discussed with: CRNA  Anesthesia Plan Comments:         Anesthesia Quick Evaluation

## 2022-02-15 NOTE — Transfer of Care (Signed)
Immediate Anesthesia Transfer of Care Note  Patient: Jefry Lesinski Caratachea  Procedure(s) Performed: AMPUTATION 5TH RIGHT TOE (Right: Toe)  Patient Location: Short Stay  Anesthesia Type:MAC  Level of Consciousness: awake, alert  and oriented  Airway & Oxygen Therapy: Patient Spontanous Breathing  Post-op Assessment: Report given to RN and Post -op Vital signs reviewed and stable  Post vital signs: Reviewed and stable  Last Vitals:  Vitals Value Taken Time  BP 147/72 02/15/22 1329  Temp 36.4 C 02/15/22 1329  Pulse 65 02/15/22 1329  Resp 10 02/15/22 1329  SpO2 100 % 02/15/22 1329    Last Pain:  Vitals:   02/15/22 1329  TempSrc: Oral  PainSc: 0-No pain         Complications: No notable events documented.

## 2022-02-15 NOTE — Op Note (Signed)
02/15/2022  1:22 PM  PATIENT:  Theodore Long  66 y.o. male  PRE-OPERATIVE DIAGNOSIS:  HAMMER TOE RIGHT FOOT  POST-OPERATIVE DIAGNOSIS:  HAMMER TOE RIGHT FOOT  PROCEDURE:  Procedure(s): AMPUTATION 5TH RIGHT TOE (Right)  SURGEON:  Surgeon(s) and Role:    * Tyson Babinski, DPM - Primary  PHYSICIAN ASSISTANT:   ASSISTANTS: none   ANESTHESIA:   local, IV sedation, and MAC  EBL:  None.    BLOOD ADMINISTERED:none  LOCAL MEDICATIONS USED:  MARCAINE   , LIDOCAINE , and Amount: 6 ml  SPECIMEN:  No Specimen  TOURNIQUET:   Total Tourniquet Time Documented: Calf (Right) - 29 minutes Total: Calf (Right) - 29 minutes  PLAN OF CARE: Discharge to home.   PATIENT DISPOSITION:  Short Stay   Patient was brought into the operating room laid supine on the operating table. Ankle tourniquet was applied to the surgical extremity. Following IV sedation, a local block was achieved using 6 cc of mixture of 1% plain lidocaine with 0.5% marcaine. The foot was the prepped, scrubbed and draped in aseptic manner. Using an esmarch band the tourniquet on the surgical site was inflatted at 236mHG.    Attention was directed towards the right fifth toe. 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 Previous surgical scar noted. A fish mouth incision was planned to amputate the right fifth toe. Using a 15 blade, the right fifth toe was disarticulated at the MPJ and removed. The wound site was irrigated was saline. Inspection was done at surgical site for any nonviable tissue, bone fragment, and none were found. The skin was closed using 4-0 Prolene. Dry sterile dressing applied. The tourniquet was deflated.

## 2022-02-15 NOTE — Anesthesia Procedure Notes (Addendum)
Procedure Name: MAC Date/Time: 02/15/2022 12:38 PM  Performed by: Ollen Bowl, CRNAPre-anesthesia Checklist: Patient identified, Emergency Drugs available, Suction available and Patient being monitored Patient Re-evaluated:Patient Re-evaluated prior to induction Oxygen Delivery Method: Nasal cannula Preoxygenation: Pre-oxygenation with 100% oxygen Induction Type: IV induction Placement Confirmation: positive ETCO2

## 2022-02-15 NOTE — Brief Op Note (Signed)
02/15/2022  1:22 PM  PATIENT:  Theodore Long  66 y.o. male  PRE-OPERATIVE DIAGNOSIS:  HAMMER TOE RIGHT FOOT  POST-OPERATIVE DIAGNOSIS:  HAMMER TOE RIGHT FOOT  PROCEDURE:  Procedure(s): AMPUTATION 5TH RIGHT TOE (Right)  SURGEON:  Surgeon(s) and Role:    * Tyson Babinski, DPM - Primary  PHYSICIAN ASSISTANT:   ASSISTANTS: none   ANESTHESIA:   local, IV sedation, and MAC  EBL:  None.    BLOOD ADMINISTERED:none  DRAINS: none   LOCAL MEDICATIONS USED:  MARCAINE   , LIDOCAINE , and Amount: 6 ml  SPECIMEN:  No Specimen  DISPOSITION OF SPECIMEN:  N/A  COUNTS:  YES  TOURNIQUET:   Total Tourniquet Time Documented: Calf (Right) - 29 minutes Total: Calf (Right) - 29 minutes   DICTATION: .Viviann Spare Dictation  PLAN OF CARE: Discharge to home.   PATIENT DISPOSITION:  Short Stay   Delay start of Pharmacological VTE agent (>24hrs) due to surgical blood loss or risk of bleeding: not applicable

## 2022-02-15 NOTE — Discharge Instructions (Signed)

## 2022-02-17 NOTE — Anesthesia Postprocedure Evaluation (Signed)
Anesthesia Post Note  Patient: Curlie Macken Friedland  Procedure(s) Performed: AMPUTATION 5TH RIGHT TOE (Right: Toe)  Patient location during evaluation: Phase II Anesthesia Type: General Level of consciousness: awake Pain management: pain level controlled Vital Signs Assessment: post-procedure vital signs reviewed and stable Respiratory status: spontaneous breathing and respiratory function stable Cardiovascular status: blood pressure returned to baseline and stable Postop Assessment: no headache and no apparent nausea or vomiting Anesthetic complications: no Comments: Late entry   No notable events documented.   Last Vitals:  Vitals:   02/15/22 1045 02/15/22 1329  BP: 139/71 (!) 147/72  Pulse: (!) 55 65  Resp: 15 10  Temp: 36.5 C 36.4 C  SpO2: 100% 100%    Last Pain:  Vitals:   02/16/22 1502  TempSrc:   PainSc: 0-No pain                 Windell Norfolk

## 2022-02-21 ENCOUNTER — Encounter (HOSPITAL_COMMUNITY): Payer: Self-pay | Admitting: Podiatry

## 2022-06-23 IMAGING — DX DG FOOT COMPLETE 3+V*R*
3 series · 3 of 3 positions shown · non-contrast
Comparison: Radiograph 04/14/2020

CLINICAL DATA: Preop fifth digit pain

EXAM:
RIGHT FOOT COMPLETE - 3+ VIEW

[foot ap]
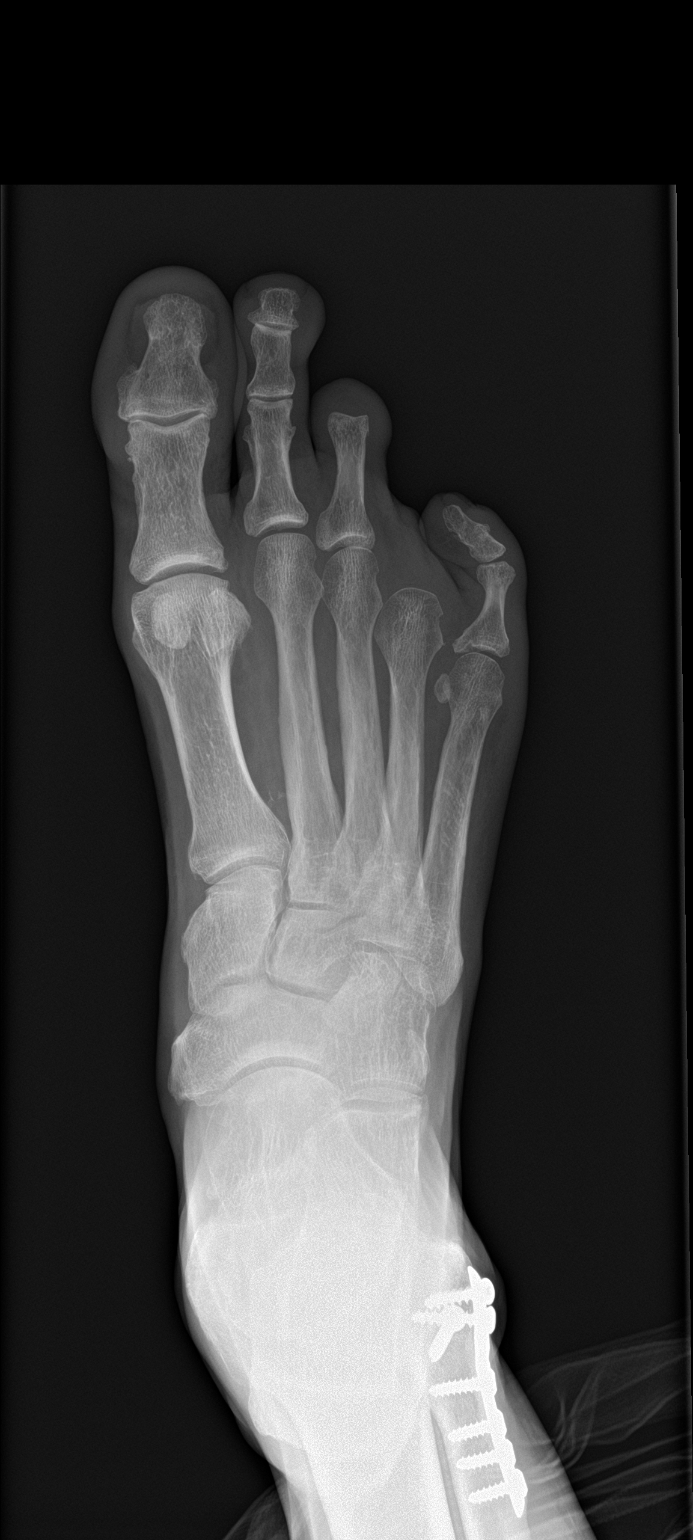

[foot obl]
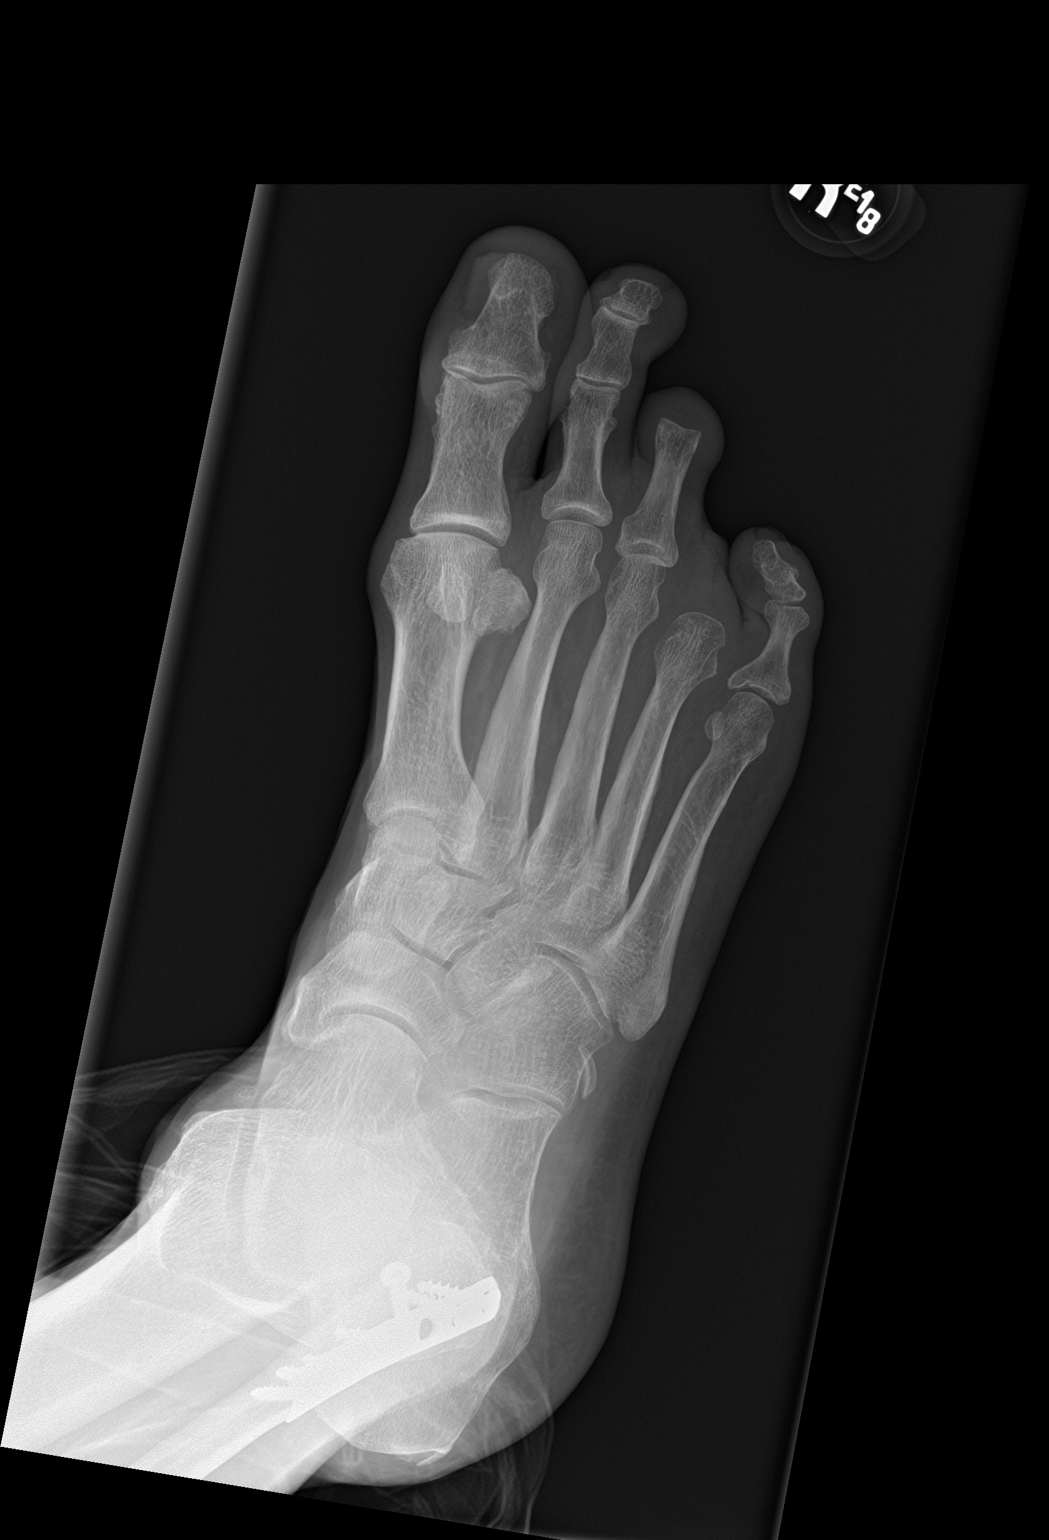

[foot lat]
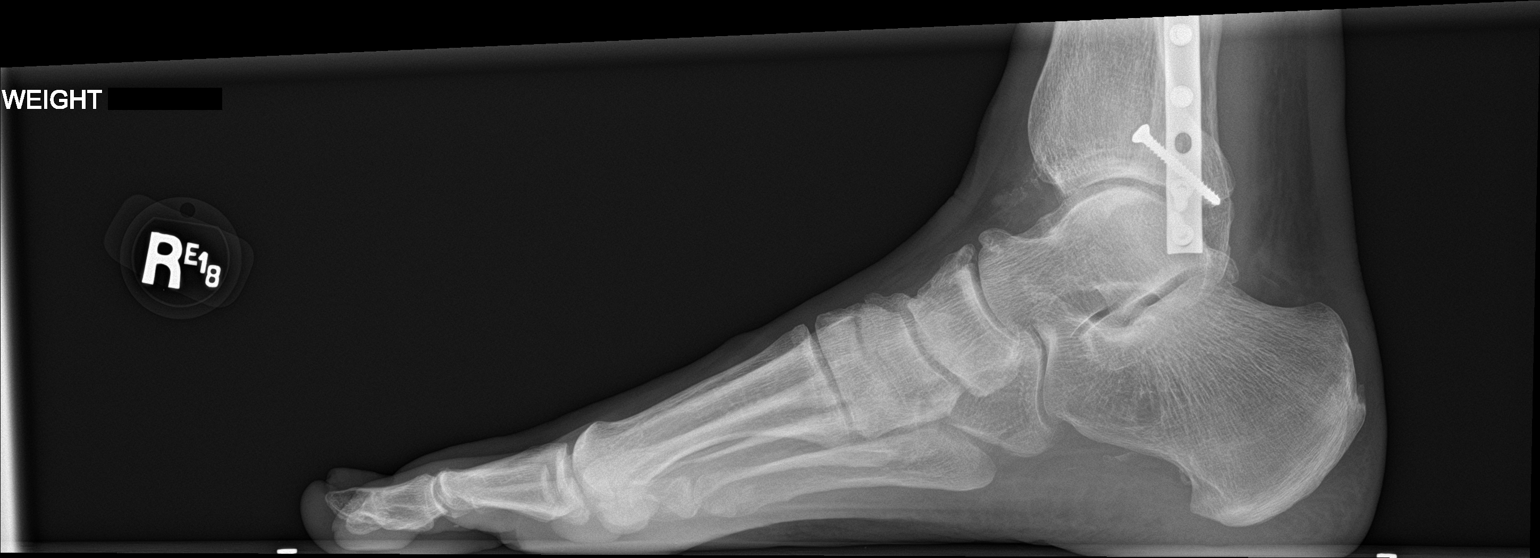

[3 of 3 positions shown; findings below may reference images not displayed]

FINDINGS: Postsurgical changes of third and fourth digit amputation. Medial
deviation of the fifth digit. There is no acute osseous abnormality.
There is mild dorsal midfoot spurring. Minimal degenerative changes
of the great toe MTP and interphalangeal joint. Mild degenerative
change of the fifth toe MTP joint. Partially visualized prior distal
fibular fixation. Os peroneum. Vascular calcifications. Tiny
Achilles enthesophyte.
IMPRESSION: Postsurgical changes of third and fourth digit amputation.

Medial deviation of the fifth digit. Mild fifth MTP degenerative
change.

No acute osseous abnormality.

## 2022-06-25 IMAGING — DX DG FOOT COMPLETE 3+V*R*
3 series · 3 of 3 positions shown · non-contrast
Comparison: 04/18/2021

CLINICAL DATA: Right foot surgery

EXAM:
RIGHT FOOT COMPLETE - 3+ VIEW

[foot obl]
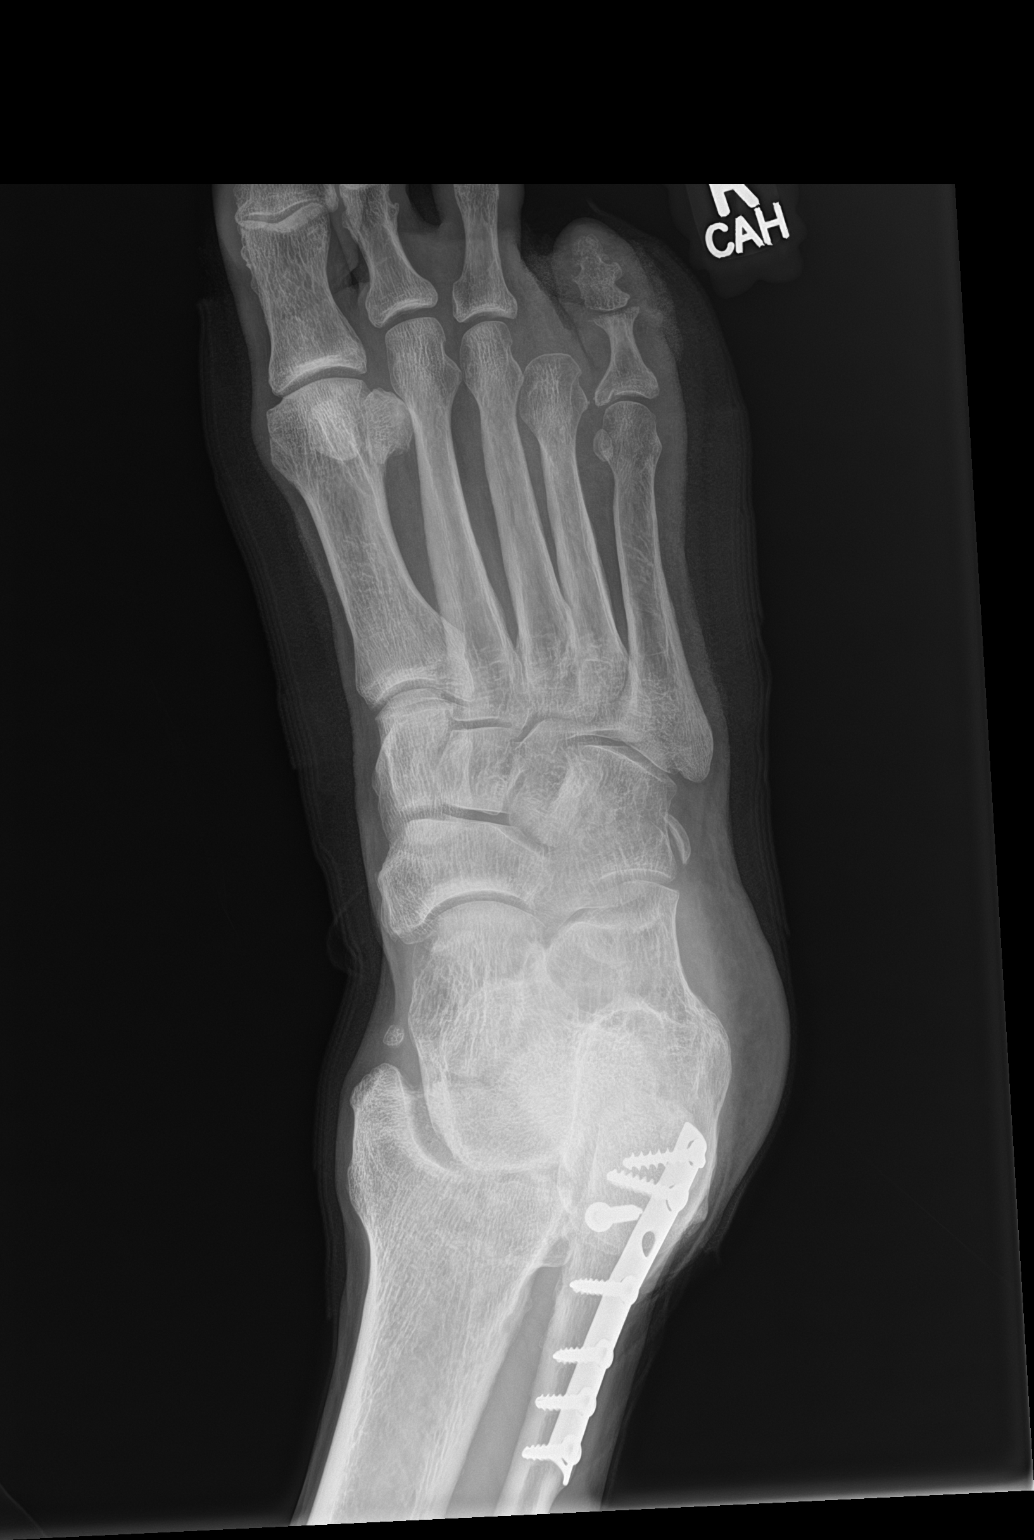

[foot lat]
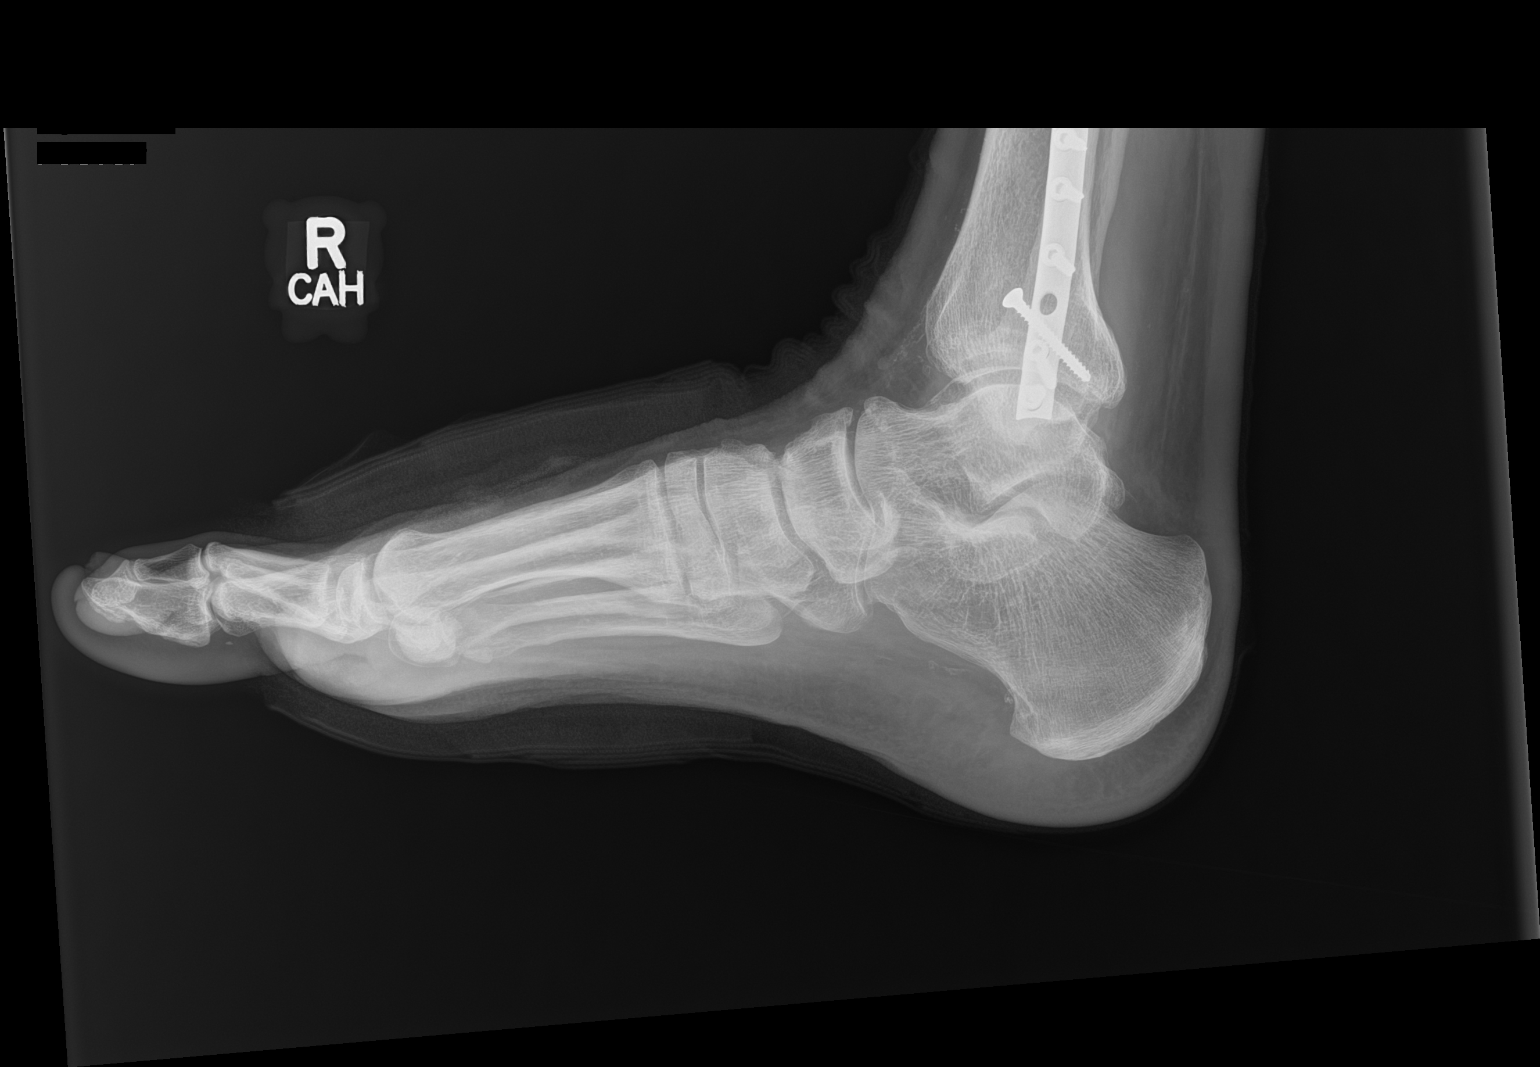

[foot ap]
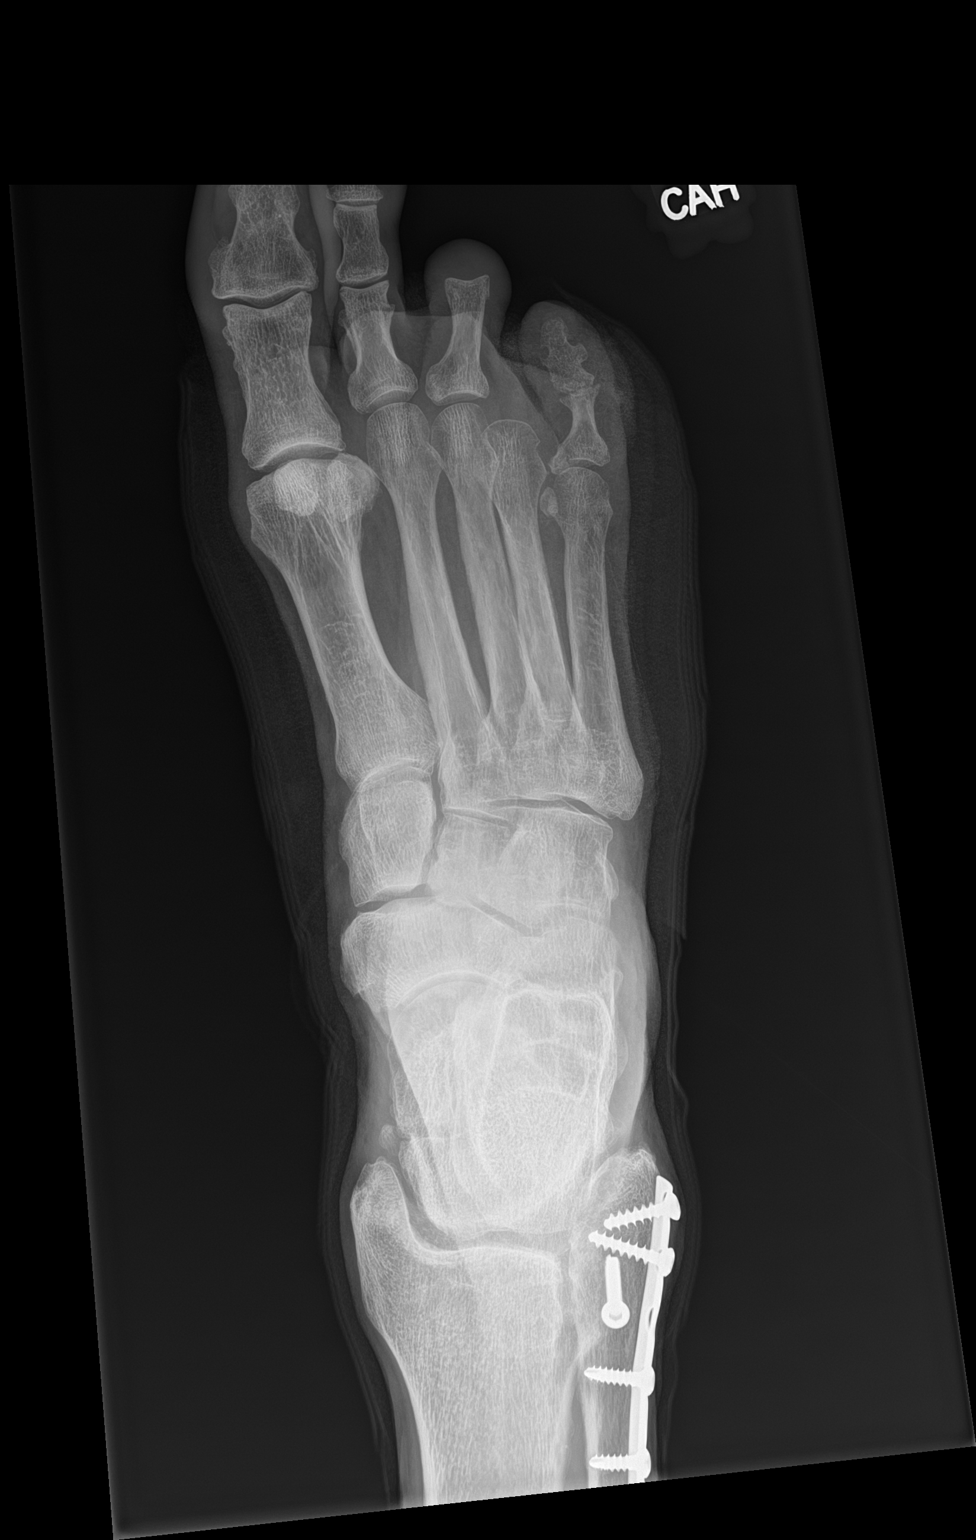

[3 of 3 positions shown; findings below may reference images not displayed]

FINDINGS: Interval postsurgical changes from right fifth toe hammertoe
correction. Improved alignment. Postsurgical changes from prior
amputations of the third and fourth toes. No acute fracture. No
dislocation. Expected postoperative changes within the soft tissues
of the fifth toe.
IMPRESSION: Status post right fifth toe hammertoe correction with improved
alignment.
